# Patient Record
Sex: Female | Born: 1980 | Race: Black or African American | Hispanic: No | Marital: Single | Smoking: Never smoker
Health system: Southern US, Community
[De-identification: ages and names within clinical notes are randomized; demographics above are authoritative.]

## PROBLEM LIST (undated history)

## (undated) DIAGNOSIS — F419 Anxiety disorder, unspecified: Secondary | ICD-10-CM

## (undated) DIAGNOSIS — F32A Depression, unspecified: Secondary | ICD-10-CM

## (undated) DIAGNOSIS — I10 Essential (primary) hypertension: Secondary | ICD-10-CM

## (undated) DIAGNOSIS — G4700 Insomnia, unspecified: Secondary | ICD-10-CM

## (undated) DIAGNOSIS — T7840XA Allergy, unspecified, initial encounter: Secondary | ICD-10-CM

## (undated) DIAGNOSIS — J45909 Unspecified asthma, uncomplicated: Secondary | ICD-10-CM

## (undated) HISTORY — DX: Depression, unspecified: F32.A

## (undated) HISTORY — DX: Allergy, unspecified, initial encounter: T78.40XA

## (undated) HISTORY — PX: EYE SURGERY: SHX253

## (undated) HISTORY — DX: Unspecified asthma, uncomplicated: J45.909

## (undated) HISTORY — DX: Insomnia, unspecified: G47.00

## (undated) HISTORY — PX: FRACTURE SURGERY: SHX138

## (undated) HISTORY — DX: Anxiety disorder, unspecified: F41.9

## (undated) HISTORY — DX: Essential (primary) hypertension: I10

---
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
## 2015-12-31 HISTORY — PX: ANKLE FRACTURE SURGERY: SHX122

## 2019-05-25 ENCOUNTER — Ambulatory Visit: Payer: 59 | Attending: Internal Medicine

## 2019-05-25 DIAGNOSIS — Z23 Encounter for immunization: Secondary | ICD-10-CM

## 2019-05-25 NOTE — Progress Notes (Signed)
   Covid-19 Vaccination Clinic  Name:  Kaitlyn Choi    MRN: [id number] DOB: 08-07-1980  05/25/2019  [name] was observed post Covid-19 immunization for 15 minutes without incident. She was provided with Vaccine Information Sheet and instruction to access the V-Safe system.   [name] was instructed to call 911 with any severe reactions post vaccine: Marland Kitchen Difficulty breathing  . Swelling of face and throat  . A fast heartbeat  . A bad rash all over body  . Dizziness and weakness   Immunizations Administered    Name Date Dose VIS Date Route   Pfizer COVID-19 Vaccine 05/25/2019  8:24 AM 0.3 mL 02/09/2019 Intramuscular   Manufacturer: [employer], [employer]   Lot: [id number]   NDC: [address]-3

## 2019-06-20 ENCOUNTER — Ambulatory Visit: Payer: 59 | Attending: Internal Medicine

## 2019-06-20 DIAGNOSIS — Z23 Encounter for immunization: Secondary | ICD-10-CM

## 2019-06-20 NOTE — Progress Notes (Signed)
   Covid-19 Vaccination Clinic  Name:  Mckennah Kretchmer    MRN: [id number] DOB: 26-May-1980  06/20/2019  Ms. Fair was observed post Covid-19 immunization for 15 minutes without incident. She was provided with Vaccine Information Sheet and instruction to access the V-Safe system.   Ms. Eberlin was instructed to call 911 with any severe reactions post vaccine: Marland Kitchen Difficulty breathing  . Swelling of face and throat  . A fast heartbeat  . A bad rash all over body  . Dizziness and weakness   Immunizations Administered    Name Date Dose VIS Date Route   Pfizer COVID-19 Vaccine 06/20/2019 11:58 AM 0.3 mL 04/25/2018 Intramuscular   Manufacturer: [employer], [employer]   Lot: [id number]   NDC: [address]-5

## 2020-12-09 LAB — BASIC METABOLIC PANEL
CO2: 22 (ref 13–22)
Chloride: 104 (ref 99–108)
Creatinine: 0.9 (ref 0.5–1.1)
Glucose: 90
Potassium: 4.3 (ref 3.4–5.3)
Sodium: 140 (ref 137–147)

## 2020-12-09 LAB — LIPID PANEL
Cholesterol: 153 (ref 0–200)
HDL: 68 (ref 35–70)
LDL Cholesterol: 54
Triglycerides: 195 — AB (ref 40–160)

## 2020-12-09 LAB — HEPATIC FUNCTION PANEL
ALT: 14 (ref 7–35)
AST: 19 (ref 13–35)
Alkaline Phosphatase: 76 (ref 25–125)
Bilirubin, Total: 0.3

## 2020-12-09 LAB — COMPREHENSIVE METABOLIC PANEL
Albumin: 4 (ref 3.5–5.0)
Calcium: 9.3 (ref 8.7–10.7)
GFR calc Af Amer: 81
Globulin: 2.8

## 2020-12-09 LAB — HEMOGLOBIN A1C: Hemoglobin A1C: 5.6

## 2020-12-09 LAB — TSH: TSH: 1.81 (ref 0.41–5.90)

## 2020-12-17 ENCOUNTER — Encounter: Payer: Self-pay | Admitting: Family Medicine

## 2020-12-17 ENCOUNTER — Other Ambulatory Visit: Payer: Self-pay

## 2020-12-17 ENCOUNTER — Ambulatory Visit: Payer: 59 | Admitting: Family Medicine

## 2020-12-17 ENCOUNTER — Ambulatory Visit
Admission: RE | Admit: 2020-12-17 | Discharge: 2020-12-17 | Disposition: A | Discharge status: Home / Self Care | Payer: 59 | Attending: Family Medicine | Admitting: Family Medicine

## 2020-12-17 ENCOUNTER — Ambulatory Visit
Admission: RE | Admit: 2020-12-17 | Discharge: 2020-12-17 | Disposition: A | Discharge status: Home / Self Care | Payer: 59 | Source: Ambulatory Visit | Attending: Family Medicine | Admitting: Family Medicine

## 2020-12-17 VITALS — BP 124/86 | HR 86 | Temp 98.3°F | Ht 67.0 in | Wt 231.0 lb

## 2020-12-17 DIAGNOSIS — M545 Low back pain, unspecified: Secondary | ICD-10-CM | POA: Insufficient documentation

## 2020-12-17 DIAGNOSIS — R8290 Unspecified abnormal findings in urine: Secondary | ICD-10-CM

## 2020-12-17 DIAGNOSIS — F419 Anxiety disorder, unspecified: Secondary | ICD-10-CM

## 2020-12-17 DIAGNOSIS — R109 Unspecified abdominal pain: Secondary | ICD-10-CM | POA: Insufficient documentation

## 2020-12-17 DIAGNOSIS — G8929 Other chronic pain: Secondary | ICD-10-CM

## 2020-12-17 DIAGNOSIS — F32A Depression, unspecified: Secondary | ICD-10-CM | POA: Insufficient documentation

## 2020-12-17 HISTORY — DX: Low back pain, unspecified: M54.50

## 2020-12-17 HISTORY — DX: Unspecified abdominal pain: R10.9

## 2020-12-17 HISTORY — DX: Unspecified abnormal findings in urine: R82.90

## 2020-12-17 HISTORY — DX: Depression, unspecified: F32.A

## 2020-12-17 HISTORY — DX: Anxiety disorder, unspecified: F41.9

## 2020-12-17 LAB — POCT URINALYSIS DIPSTICK
Bilirubin, UA: NEGATIVE
Glucose, UA: NEGATIVE
Ketones, UA: NEGATIVE
Leukocytes, UA: NEGATIVE
Nitrite, UA: NEGATIVE
Protein, UA: NEGATIVE
Spec Grav, UA: 1.015 (ref 1.010–1.025)
Urobilinogen, UA: 0.2 E.U./dL
pH, UA: 6 (ref 5.0–8.0)

## 2020-12-17 MED ORDER — CITALOPRAM HYDROBROMIDE 10 MG PO TABS: 10.0000 mg | ORAL_TABLET | Freq: Every day | ORAL | 3 refills | Status: DC

## 2020-12-17 NOTE — Patient Instructions (Signed)
-   Obtain x-rays - Start citalopram 10 mg daily - We will contact you with xray and urine results - Contact for questions - Return in 2 weeks

## 2020-12-18 ENCOUNTER — Encounter: Payer: Self-pay | Admitting: Family Medicine

## 2020-12-18 NOTE — Assessment & Plan Note (Signed)
Depression screen PHQ 2/9 12/17/2020  Decreased Interest 2  Down, Depressed, Hopeless 2  PHQ - 2 Score 4  Altered sleeping 3  Tired, decreased energy 3  Change in appetite 1  Feeling bad or failure about yourself  0  Trouble concentrating 2  Moving slowly or fidgety/restless 0  Suicidal thoughts 0  PHQ-9 Score 13  Difficult doing work/chores Extremely dIfficult   GAD 7 : Generalized Anxiety Score 12/17/2020  Nervous, Anxious, on Edge 3  Control/stop worrying 2  Worry too much - different things 2  Trouble relaxing 2  Restless 0  Easily annoyed or irritable 3  Afraid - awful might happen 0  Total GAD 7 Score 12  Anxiety Difficulty Extremely difficult   Chronic issue with ongoing exacerbation, both pharmacologic and nonpharmacologic therapies outlined and patient is amenable to initiation of citalopram, this was prescribed will make a close follow-up in 2 weeks for this.

## 2020-12-18 NOTE — Assessment & Plan Note (Signed)
Chronic condition with continued exacerbation, examination findings do show focality to the paraspinal lumbar musculature and right hip intra-articularly, and dedicated x-rays of the lumbar spine and right hip ordered, we will follow-up on this issue at her return in 2 weeks.

## 2020-12-18 NOTE — Progress Notes (Signed)
Primary Care / Sports Medicine Office Visit  Patient Information:  Patient ID: Kaitlyn Choi, female DOB: May 06, 1980 Age: 40 y.o. MRN: [id number]   Kaitlyn Choi is a pleasant 40 y.o. female presenting with the following:  Chief Complaint  Patient presents with   New Patient (Initial Visit)   Establish Care    Following with weight loss program [facility], had labs drawn last week and concerned about high triglycerides and WBC in urine, did not fast for labs   Insomnia    X4 years; trouble going to sleep and staying asleep; takes Unisom, Benadryl, and Melatonin with some relief   Hip Pain    Right; x5 years; no known injury or trauma; intermittent; no pain in office today    Review of Systems pertinent details above   Patient Active Problem List   Diagnosis Date Noted   Anxiety and depression 12/17/2020   Flank pain 12/17/2020   Abnormal urinalysis 12/17/2020   Chronic right-sided low back pain without sciatica 12/17/2020   Asthma action plan declined 12/17/1983   Past Medical History:  Diagnosis Date   Asthma    Insomnia    Outpatient Encounter Medications as of 12/17/2020  Medication Sig   albuterol (VENTOLIN HFA) 108 (90 Base) MCG/ACT inhaler Inhale 1 puff into the lungs daily as needed.   citalopram (CELEXA) 10 MG tablet Take 1 tablet (10 mg total) by mouth daily.   clobetasol (TEMOVATE) 0.05 % external solution Apply 1 application topically 2 (two) times daily.   diphenhydrAMINE (BENADRYL) 25 MG tablet Take 50 mg by mouth at bedtime as needed.   doxylamine, Sleep, (UNISOM) 25 MG tablet Take 25 mg by mouth at bedtime as needed for sleep.   EPINEPHrine (PRIMATENE MIST IN) Inhale 2 puffs into the lungs as needed.   MELATONIN PO Take 12 mg by mouth at bedtime.   Naltrexone-buPROPion HCl ER (CONTRAVE) 8-90 MG TB12 Take 1 tablet by mouth 2 (two) times daily. (Patient not taking: Reported on 12/17/2020)   No facility-administered encounter medications on file as of  12/17/2020.   Past Surgical History:  Procedure Laterality Date   ANKLE FRACTURE SURGERY Right 12/2015    Vitals:   12/17/20 1006  BP: 124/86  Pulse: 86  Temp: 98.3 F (36.8 C)  SpO2: 98%   Vitals:   12/17/20 1006  Weight: 231 lb (104.8 kg)  Height: 5\' 7"  (1.702 m)   Body mass index is 36.18 kg/m.  DG Lumbar Spine Complete  Result Date: 12/17/2020 CLINICAL DATA:  Low back pain for years, no known injury, initial encounter EXAM: LUMBAR SPINE - COMPLETE 4+ VIEW COMPARISON:  None. FINDINGS: Five lumbar type vertebral bodies are well visualized. Vertebral body height is well maintained. No pars defects are noted. No anterolisthesis is seen. No soft tissue abnormality is noted. IMPRESSION: No acute abnormality seen. Electronically Signed   By: 12/19/2020 M.D.   On: 12/17/2020 23:57   DG Hip Unilat W OR W/O Pelvis 2-3 Views Right  Result Date: 12/17/2020 CLINICAL DATA:  Chronic right hip pain initial encounter EXAM: DG HIP (WITH OR WITHOUT PELVIS) 3V RIGHT COMPARISON:  None. FINDINGS: Pelvic ring is intact. Degenerative changes of the hip joints are noted bilaterally. No acute fracture or dislocation is seen. No soft tissue abnormality is noted. IMPRESSION: No acute abnormality noted. Electronically Signed   By: 12/19/2020 M.D.   On: 12/17/2020 23:58     Independent interpretation of notes and tests performed by another  provider:   None  Procedures performed:   None  Pertinent History, Exam, Impression, and Recommendations:   Anxiety and depression Depression screen PHQ 2/9 12/17/2020  Decreased Interest 2  Down, Depressed, Hopeless 2  PHQ - 2 Score 4  Altered sleeping 3  Tired, decreased energy 3  Change in appetite 1  Feeling bad or failure about yourself  0  Trouble concentrating 2  Moving slowly or fidgety/restless 0  Suicidal thoughts 0  PHQ-9 Score 13  Difficult doing work/chores Extremely dIfficult   GAD 7 : Generalized Anxiety Score 12/17/2020   Nervous, Anxious, on Edge 3  Control/stop worrying 2  Worry too much - different things 2  Trouble relaxing 2  Restless 0  Easily annoyed or irritable 3  Afraid - awful might happen 0  Total GAD 7 Score 12  Anxiety Difficulty Extremely difficult   Chronic issue with ongoing exacerbation, both pharmacologic and nonpharmacologic therapies outlined and patient is amenable to initiation of citalopram, this was prescribed will make a close follow-up in 2 weeks for this.  Abnormal urinalysis Patient has recently completed antibiotics for the same issue, still noting right-sided flank pain.  Physical examination reveals no CVA tenderness, abdominal examination benign, mild suprapubic tenderness. UA with blood, this will be sent for culture.  We will follow-up results once obtained.  Chronic right-sided low back pain without sciatica Chronic condition with continued exacerbation, examination findings do show focality to the paraspinal lumbar musculature and right hip intra-articularly, and dedicated x-rays of the lumbar spine and right hip ordered, we will follow-up on this issue at her return in 2 weeks.   Orders & Medications Meds ordered this encounter  Medications   citalopram (CELEXA) 10 MG tablet    Sig: Take 1 tablet (10 mg total) by mouth daily.    Dispense:  30 tablet    Refill:  3   Orders Placed This Encounter  Procedures   Urine Culture   DG Lumbar Spine Complete   DG Hip Unilat W OR W/O Pelvis 2-3 Views Right   POCT Urinalysis Dipstick     Return in about 2 weeks (around 12/31/2020) for annual physical.     Jerrol Banana, MD   Primary Care Sports Medicine [facility]

## 2020-12-18 NOTE — Assessment & Plan Note (Signed)
Patient has recently completed antibiotics for the same issue, still noting right-sided flank pain.  Physical examination reveals no CVA tenderness, abdominal examination benign, mild suprapubic tenderness. UA with blood, this will be sent for culture.  We will follow-up results once obtained.

## 2020-12-19 ENCOUNTER — Encounter: Payer: Self-pay | Admitting: Family Medicine

## 2020-12-20 LAB — URINE CULTURE: Organism ID, Bacteria: NO GROWTH

## 2020-12-20 LAB — SPECIMEN STATUS REPORT

## 2020-12-22 ENCOUNTER — Other Ambulatory Visit: Payer: Self-pay | Admitting: Family Medicine

## 2020-12-22 DIAGNOSIS — E785 Hyperlipidemia, unspecified: Secondary | ICD-10-CM

## 2020-12-22 DIAGNOSIS — Z7689 Persons encountering health services in other specified circumstances: Secondary | ICD-10-CM

## 2020-12-22 MED ORDER — OZEMPIC (0.25 OR 0.5 MG/DOSE) 2 MG/1.5ML ~~LOC~~ SOPN: PEN_INJECTOR | SUBCUTANEOUS | 0 refills | Status: DC

## 2020-12-23 NOTE — Telephone Encounter (Signed)
For your information  

## 2020-12-25 NOTE — Telephone Encounter (Signed)
Labs have been abstracted.  For your information.

## 2021-01-01 ENCOUNTER — Ambulatory Visit: Payer: 59 | Admitting: Family Medicine

## 2021-01-08 ENCOUNTER — Other Ambulatory Visit: Payer: Self-pay | Admitting: Family Medicine

## 2021-01-08 DIAGNOSIS — F419 Anxiety disorder, unspecified: Secondary | ICD-10-CM

## 2021-01-08 DIAGNOSIS — F32A Depression, unspecified: Secondary | ICD-10-CM

## 2021-01-08 NOTE — Telephone Encounter (Signed)
Requested medication (s) are due for refill today: No  Requested medication (s) are on the active medication list: Yes  Last refill:  12/17/20  Future visit scheduled: Yes  Notes to clinic:  Pharmacy requesting 90 day supply.    Requested Prescriptions  Pending Prescriptions Disp Refills   citalopram (CELEXA) 10 MG tablet [Pharmacy Med Name: CITALOPRAM HBR 10 MG TABLET] 90 tablet 2    Sig: TAKE 1 TABLET BY MOUTH EVERY DAY     Psychiatry:  Antidepressants - SSRI Passed - 01/08/2021  1:31 PM      Passed - Completed PHQ-2 or PHQ-9 in the last 360 days      Passed - Valid encounter within last 6 months    Recent Outpatient Visits           3 weeks ago Anxiety and depression   [facility] Jerrol Banana, MD       Future Appointments             In 6 days Ashley Royalty, Ocie Bob, MD [facility], [facility]

## 2021-01-13 ENCOUNTER — Telehealth: Payer: Self-pay

## 2021-01-13 NOTE — Telephone Encounter (Signed)
Semaglutide,0.25 or 0.5MG /DOS, (OZEMPIC, 0.25 OR 0.5 MG/DOSE,) 2 MG/1.5ML SOPN [[id number]]

## 2021-01-13 NOTE — Telephone Encounter (Signed)
Please reschedule patient's appointment tomorrow for the first week in December per patient's request.

## 2021-01-13 NOTE — Telephone Encounter (Signed)
Please advise if okay to refill Ozempic.  [place] was scheduled for tomorrow[place] and rescheduled to 02/06/21.

## 2021-01-14 ENCOUNTER — Ambulatory Visit: Payer: 59 | Admitting: Family Medicine

## 2021-01-14 NOTE — Telephone Encounter (Signed)
Spoke with Chana Bode at [facility] who states refill request was automated.  Nothing further to do.

## 2021-01-17 ENCOUNTER — Other Ambulatory Visit: Payer: Self-pay | Admitting: Family Medicine

## 2021-01-17 DIAGNOSIS — Z6836 Body mass index (BMI) 36.0-36.9, adult: Secondary | ICD-10-CM

## 2021-01-17 DIAGNOSIS — Z7689 Persons encountering health services in other specified circumstances: Secondary | ICD-10-CM

## 2021-01-17 DIAGNOSIS — E785 Hyperlipidemia, unspecified: Secondary | ICD-10-CM

## 2021-01-18 NOTE — Telephone Encounter (Signed)
Requested Prescriptions  Pending Prescriptions Disp Refills  . Semaglutide,0.25 or 0.5MG /DOS, (OZEMPIC, 0.25 OR 0.5 MG/DOSE,) 2 MG/1.5ML SOPN [Pharmacy Med Name: OZEMPIC 0.25-0.5 MG/DOSE PEN] 1.5 mL 0    Sig: INJECT 0.25 MG SUBQ EVERY WK FOR 4 WK THEN INJECT 0.5 MG SUBQ EVERY WEEK X4 WK, THEN GO TO FULL DOSE PEN EVERY WEEK     Endocrinology:  Diabetes - GLP-1 Receptor Agonists Passed - 01/17/2021  2:06 PM      Passed - HBA1C is between 0 and 7.9 and within 180 days    Hemoglobin A1C  Date Value Ref Range Status  12/09/2020 5.6  Final         Passed - Valid encounter within last 6 months    Recent Outpatient Visits          1 month ago Anxiety and depression   [facility] Jerrol Banana, MD      Future Appointments            In 4 weeks Ashley Royalty, Ocie Bob, MD [facility], [facility]

## 2021-01-28 ENCOUNTER — Other Ambulatory Visit: Payer: Self-pay

## 2021-01-28 DIAGNOSIS — E785 Hyperlipidemia, unspecified: Secondary | ICD-10-CM

## 2021-01-28 DIAGNOSIS — Z7689 Persons encountering health services in other specified circumstances: Secondary | ICD-10-CM

## 2021-01-28 DIAGNOSIS — Z6836 Body mass index (BMI) 36.0-36.9, adult: Secondary | ICD-10-CM

## 2021-01-28 MED ORDER — OZEMPIC (0.25 OR 0.5 MG/DOSE) 2 MG/1.5ML ~~LOC~~ SOPN: 0.5000 mg | PEN_INJECTOR | SUBCUTANEOUS | 0 refills | Status: DC

## 2021-01-29 ENCOUNTER — Encounter: Payer: 59 | Admitting: Obstetrics and Gynecology

## 2021-02-06 ENCOUNTER — Ambulatory Visit: Payer: 59 | Admitting: Family Medicine

## 2021-02-16 ENCOUNTER — Ambulatory Visit: Payer: 59 | Admitting: Family Medicine

## 2021-02-16 ENCOUNTER — Encounter: Payer: 59 | Admitting: Family Medicine

## 2021-03-12 ENCOUNTER — Encounter: Payer: Self-pay | Admitting: Family Medicine

## 2021-03-12 ENCOUNTER — Other Ambulatory Visit: Payer: Self-pay

## 2021-03-12 ENCOUNTER — Telehealth (INDEPENDENT_AMBULATORY_CARE_PROVIDER_SITE_OTHER): Payer: Self-pay | Admitting: Family Medicine

## 2021-03-12 VITALS — Temp 97.3°F | Ht 67.0 in | Wt 224.0 lb

## 2021-03-12 DIAGNOSIS — F32A Depression, unspecified: Secondary | ICD-10-CM

## 2021-03-12 DIAGNOSIS — U071 COVID-19: Secondary | ICD-10-CM | POA: Insufficient documentation

## 2021-03-12 DIAGNOSIS — Z6836 Body mass index (BMI) 36.0-36.9, adult: Secondary | ICD-10-CM

## 2021-03-12 DIAGNOSIS — F419 Anxiety disorder, unspecified: Secondary | ICD-10-CM

## 2021-03-12 DIAGNOSIS — E66812 Obesity, class 2: Secondary | ICD-10-CM | POA: Insufficient documentation

## 2021-03-12 HISTORY — DX: COVID-19: U07.1

## 2021-03-12 MED ORDER — PROMETHAZINE-DM 6.25-15 MG/5ML PO SYRP: 5.0000 mL | ORAL_SOLUTION | Freq: Four times a day (QID) | ORAL | 0 refills | Status: DC | PRN

## 2021-03-12 MED ORDER — WEGOVY 1 MG/0.5ML ~~LOC~~ SOAJ: 1.0000 mg | SUBCUTANEOUS | 0 refills | Status: DC

## 2021-03-12 NOTE — Assessment & Plan Note (Signed)
Chronic condition that is stable, she has endorsed weight loss since initiating semaglutide, tolerating current dose without adverse effects.  I have advised continued dosing after discussion of nonmedication management.  She has requested a transition to [facility] from [place], a new Rx was generated in that regard today.  We will recheck her weight in clinic in 1 month at her annual physical.

## 2021-03-12 NOTE — Patient Instructions (Addendum)
-   Start daily vitamin D and zinc supplement x10 days - Use Flonase, 2 sprays in each nostril daily x10 days - Can use cough syrup on as-needed basis - Ensure adequate hydration, get plenty of rest - Additionally can use ibuprofen and antihistamine on an as-needed basis - Contact our office for any persistent symptoms without improvement after 7-10 days - Continue semaglutide weekly injections [facility] Rx sent to pharmacy), contact us for any issues filling this - Return for follow-up in 1 month for annual physical

## 2021-03-12 NOTE — Assessment & Plan Note (Signed)
Patient with congestion, productive cough, afebrile, without shortness of air over the past few days, serial testing for COVID was performed with positive test this morning.  She does report positive COVID exposure as well.  She has held from OTC treatments until she has had a chance to speak with [place].  Given her COVID-19 risk of complication score being low, I have advised supportive care, appropriate isolation guidelines, and we will coordinate a follow-up in 1 month for her annual physical.  She was advised to contact us for any persistent symptomatology without lack of improvement over the next 7-10 days.

## 2021-03-12 NOTE — Progress Notes (Signed)
Primary Care / Sports Medicine Virtual Visit  Patient Information:  Patient ID: Kaitlyn Choi, female DOB: December 13, 1980 Age: 41 y.o. MRN: [id number]   Kaitlyn Choi is a pleasant 41 y.o. female presenting with the following:  Chief Complaint  Patient presents with   Covid Positive    Home test positive this morning, 03/12/21; positive Covid exposure; productive cough, congestion    Review of Systems: No fevers, chills, night sweats, weight loss, chest pain, or shortness of breath.   Patient Active Problem List   Diagnosis Date Noted   COVID 03/12/2021   Class 2 severe obesity due to excess calories with serious comorbidity and body mass index (BMI) of 36.0 to 36.9 in adult [facility] 03/12/2021   Anxiety and depression 12/17/2020   Flank pain 12/17/2020   Abnormal urinalysis 12/17/2020   Chronic right-sided low back pain without sciatica 12/17/2020   Asthma action plan declined 12/17/1983   Past Medical History:  Diagnosis Date   Anxiety    Asthma    Depression    Insomnia    Outpatient Encounter Medications as of 03/12/2021  Medication Sig   albuterol (VENTOLIN HFA) 108 (90 Base) MCG/ACT inhaler Inhale 1 puff into the lungs daily as needed.   citalopram (CELEXA) 10 MG tablet Take 1 tablet (10 mg total) by mouth daily.   clobetasol (TEMOVATE) 0.05 % external solution Apply 1 application topically 2 (two) times daily.   diphenhydrAMINE (BENADRYL) 25 MG tablet Take 50 mg by mouth at bedtime as needed.   doxylamine, Sleep, (UNISOM) 25 MG tablet Take 25 mg by mouth at bedtime as needed for sleep.   EPINEPHrine (PRIMATENE MIST IN) Inhale 2 puffs into the lungs as needed.   MELATONIN PO Take 12 mg by mouth at bedtime.   promethazine-dextromethorphan (PROMETHAZINE-DM) 6.25-15 MG/5ML syrup Take 5 mLs by mouth 4 (four) times daily as needed for cough.   WEGOVY 1 MG/0.5ML SOAJ Inject 1 mg into the skin once a week.   [DISCONTINUED] Semaglutide,0.25 or 0.5MG /DOS, (OZEMPIC, 0.25 OR 0.5  MG/DOSE,) 2 MG/1.5ML SOPN Inject 0.5 mg into the skin every 7 (seven) days.   [DISCONTINUED] Naltrexone-buPROPion HCl ER (CONTRAVE) 8-90 MG TB12 Take 1 tablet by mouth 2 (two) times daily. (Patient not taking: Reported on 12/17/2020)   No facility-administered encounter medications on file as of 03/12/2021.   Past Surgical History:  Procedure Laterality Date   ANKLE FRACTURE SURGERY Right 12/2015    Virtual Visit via MyChart Video:   I connected with Kaitlyn Choi on 03/12/21 via MyChart Video and verified that I am speaking with the correct person using appropriate identifiers.   The limitations, risks, security and privacy concerns of performing an evaluation and management service by MyChart Video, including the higher likelihood of inaccurate diagnoses and treatments, and the availability of in person appointments were reviewed. The possible need of an additional face-to-face encounter for complete and high quality delivery of care was discussed. The patient was also made aware that there may be a patient responsible charge related to this service. The patient expressed understanding and wishes to proceed.  Provider location is in medical facility. Patient location is at their home, different from provider location. People involved in care of the patient during this telehealth encounter were myself, my nurse/medical assistant, and my front office/scheduling team member.  Objective findings:   General: Speaking full sentences, no audible heavy breathing. Sounds alert and appropriately interactive. Well-appearing. Face symmetric. Extraocular movements intact. Pupils equal and round. No nasal  flaring or accessory muscle use visualized.  Independent interpretation of notes and tests performed by another provider:   None  Pertinent History, Exam, Impression, and Recommendations:   COVID Patient with congestion, productive cough, afebrile, without shortness of air over the past few days,  serial testing for COVID was performed with positive test this morning.  She does report positive COVID exposure as well.  She has held from OTC treatments until she has had a chance to speak with [place].  Given her COVID-19 risk of complication score being low, I have advised supportive care, appropriate isolation guidelines, and we will coordinate a follow-up in 1 month for her annual physical.  She was advised to contact us for any persistent symptomatology without lack of improvement over the next 7-10 days.  Class 2 severe obesity due to excess calories with serious comorbidity and body mass index (BMI) of 36.0 to 36.9 in adult [facility]) Chronic condition that is stable, she has endorsed weight loss since initiating semaglutide, tolerating current dose without adverse effects.  I have advised continued dosing after discussion of nonmedication management.  She has requested a transition to [facility] from [place], a new Rx was generated in that regard today.  We will recheck her weight in clinic in 1 month at her annual physical.  Anxiety and depression Chronic condition that is stable, has been tolerating citalopram 10 mg well without issues reported.  Has noted improvement.  At this stage plan to continue current regimen, need for further titration can be reviewed at her return for annual physical.  Orders & Medications Meds ordered this encounter  Medications   WEGOVY 1 MG/0.5ML SOAJ    Sig: Inject 1 mg into the skin once a week.    Dispense:  4 mL    Refill:  0   promethazine-dextromethorphan (PROMETHAZINE-DM) 6.25-15 MG/5ML syrup    Sig: Take 5 mLs by mouth 4 (four) times daily as needed for cough.    Dispense:  118 mL    Refill:  0   No orders of the defined types were placed in this encounter.    I discussed the above assessment and treatment plan with the patient. The patient was provided an opportunity to ask questions and all were answered. The patient agreed with the plan and demonstrated  an understanding of the instructions.   The patient was advised to call back or seek an in-person evaluation if the symptoms worsen or if the condition fails to improve as anticipated.   Jerrol Banana, MD   Primary Care Sports Medicine [facility]

## 2021-03-12 NOTE — Assessment & Plan Note (Signed)
Chronic condition that is stable, has been tolerating citalopram 10 mg well without issues reported.  Has noted improvement.  At this stage plan to continue current regimen, need for further titration can be reviewed at her return for annual physical.

## 2021-03-13 ENCOUNTER — Telehealth: Payer: Self-pay

## 2021-03-13 NOTE — Telephone Encounter (Signed)
PA submitted yesterday for [facility] through CoverMyMeds.  Received confirmation of approved coverage for [facility] via fax from 03/12/2021-09/10/2021 through [facility].  For your information.

## 2021-03-20 ENCOUNTER — Encounter: Payer: Self-pay | Admitting: Family Medicine

## 2021-03-23 ENCOUNTER — Encounter: Payer: 59 | Admitting: Obstetrics and Gynecology

## 2021-04-13 ENCOUNTER — Encounter: Payer: Self-pay | Admitting: Family Medicine

## 2021-04-14 ENCOUNTER — Other Ambulatory Visit: Payer: Self-pay | Admitting: Family Medicine

## 2021-04-14 DIAGNOSIS — Z6836 Body mass index (BMI) 36.0-36.9, adult: Secondary | ICD-10-CM

## 2021-04-15 NOTE — Telephone Encounter (Signed)
Requested medications are due for refill today.  yes  Requested medications are on the active medications list.  yes  Last refill. 03/12/2021 78mL 0 refills  Future visit scheduled.   yes  Notes to clinic.  Trial with this medication. Appointment with Dr. Ashley Royalty tomorrow. Please advise.    Requested Prescriptions  Pending Prescriptions Disp Refills   WEGOVY 1 MG/0.5ML SOAJ [Pharmacy Med Name: WEGOVY 1 MG/0.5 ML PEN]      Sig: INJECT 1MG  INTO THE SKIN ONCE A WEEK     Endocrinology:  Diabetes - GLP-1 Receptor Agonists - semaglutide Passed - 04/14/2021 12:39 PM      Passed - HBA1C in normal range and within 180 days    Hemoglobin A1C  Date Value Ref Range Status  12/09/2020 5.6  Final          Passed - Cr in normal range and within 360 days    Creatinine  Date Value Ref Range Status  12/09/2020 0.9 0.5 - 1.1 Final          Passed - Valid encounter within last 6 months    Recent Outpatient Visits           1 month ago COVID   [facility], MD   3 months ago Anxiety and depression   [facility] Jerrol Banana, MD       Future Appointments             Tomorrow Jerrol Banana, MD [facility], [facility]

## 2021-04-16 ENCOUNTER — Ambulatory Visit (INDEPENDENT_AMBULATORY_CARE_PROVIDER_SITE_OTHER): Payer: BC Managed Care – PPO | Admitting: Family Medicine

## 2021-04-16 ENCOUNTER — Encounter: Payer: Self-pay | Admitting: Family Medicine

## 2021-04-16 ENCOUNTER — Other Ambulatory Visit: Payer: Self-pay

## 2021-04-16 VITALS — BP 142/94 | HR 86 | Ht 67.0 in | Wt 225.0 lb

## 2021-04-16 DIAGNOSIS — Z0000 Encounter for general adult medical examination without abnormal findings: Secondary | ICD-10-CM | POA: Diagnosis not present

## 2021-04-16 DIAGNOSIS — R7989 Other specified abnormal findings of blood chemistry: Secondary | ICD-10-CM | POA: Diagnosis not present

## 2021-04-16 DIAGNOSIS — Z6836 Body mass index (BMI) 36.0-36.9, adult: Secondary | ICD-10-CM

## 2021-04-16 DIAGNOSIS — Z23 Encounter for immunization: Secondary | ICD-10-CM

## 2021-04-16 DIAGNOSIS — F419 Anxiety disorder, unspecified: Secondary | ICD-10-CM

## 2021-04-16 DIAGNOSIS — Z13220 Encounter for screening for lipoid disorders: Secondary | ICD-10-CM

## 2021-04-16 DIAGNOSIS — R1011 Right upper quadrant pain: Secondary | ICD-10-CM

## 2021-04-16 DIAGNOSIS — F32A Depression, unspecified: Secondary | ICD-10-CM

## 2021-04-16 DIAGNOSIS — G4700 Insomnia, unspecified: Secondary | ICD-10-CM

## 2021-04-16 DIAGNOSIS — R739 Hyperglycemia, unspecified: Secondary | ICD-10-CM | POA: Diagnosis not present

## 2021-04-16 DIAGNOSIS — R7301 Impaired fasting glucose: Secondary | ICD-10-CM | POA: Diagnosis not present

## 2021-04-16 DIAGNOSIS — E66812 Obesity, class 2: Secondary | ICD-10-CM

## 2021-04-16 HISTORY — DX: Right upper quadrant pain: R10.11

## 2021-04-16 HISTORY — DX: Encounter for general adult medical examination without abnormal findings: Z00.00

## 2021-04-16 MED ORDER — WEGOVY 1.7 MG/0.75ML ~~LOC~~ SOAJ: 1.7000 mg | SUBCUTANEOUS | 0 refills | Status: DC

## 2021-04-16 MED ORDER — CITALOPRAM HYDROBROMIDE 10 MG PO TABS: 10.0000 mg | ORAL_TABLET | Freq: Every day | ORAL | 0 refills | Status: DC

## 2021-04-16 NOTE — Patient Instructions (Addendum)
-   Obtain fasting labs with orders provided (can have water or black coffee but otherwise no food or drink x 8 hours before labs) - Review information provided - Increase physical activity, free water and fiber intake - Restart Celexa 10 mg daily - Start new dose of Wegovy weekly - Attend eye doctor annually, dentist every 6 months, work towards or maintain 30 minutes of moderate intensity physical activity at least 5 days per week, and consume a balanced diet - Return in 1 year for physical - Contact us for any questions between now and then   [facility]: [phone]

## 2021-04-16 NOTE — Assessment & Plan Note (Signed)
Annual examination completed, risk stratification labs ordered, anticipatory guidance provided.  We will follow labs once resulted.  Tdap administered today. 

## 2021-04-16 NOTE — Assessment & Plan Note (Signed)
Patient has been tolerating gradually titrating doses of Wegovy, has noted mild GI disturbance on recent 1 mg q. weekly dose but not at an intolerable level.  Her BMI has decreased from initial visit at 36.18 to current 35.24.  We discussed the nature of Wegovy, overarching goals, and discussed further titration which she is amenable to.  I have written for new Wegovy 1.7 mg weekly, Rx sent accordingly.  She will return in 4 weeks for reevaluation and possible further titration.  Chronic condition, active/uncontrolled, Rx management

## 2021-04-16 NOTE — Progress Notes (Signed)
Annual Physical Exam Visit  Patient Information:  Patient ID: Kaitlyn Choi, female DOB: 11-Jan-1981 Age: 41 y.o. MRN: [id number]   Subjective:   CC: Annual Physical Exam  HPI:  Kaitlyn Choi is here for their annual physical.  I reviewed the past medical history, family history, social history, surgical history, and allergies today and changes were made as necessary.  Please see the problem list section below for additional details.  Past Medical History: Past Medical History:  Diagnosis Date   Anxiety    Asthma    Depression    Insomnia    Past Surgical History: Past Surgical History:  Procedure Laterality Date   ANKLE FRACTURE SURGERY Right 12/2015   Family History: Family History  Problem Relation Age of Onset   Stroke Father    Lung cancer Maternal Grandmother    Sudden Cardiac Death Maternal Grandfather    Hypertension Paternal Grandmother    Kidney failure Paternal Grandmother    Breast cancer Paternal Grandmother    Stroke Paternal Grandfather    Allergies: Allergies  Allergen Reactions   Latex Rash   Health Maintenance: Health Maintenance  Topic Date Due   PAP SMEAR-Modifier  12/04/2021   TETANUS/TDAP  04/17/2031   INFLUENZA VACCINE  Completed   COVID-19 Vaccine  Completed   Hepatitis C Screening  Completed   HIV Screening  Completed   HPV VACCINES  Aged Out    HM Colonoscopy     This patient has no relevant Health Maintenance data.      Medications: Current Outpatient Medications on File Prior to Visit  Medication Sig Dispense Refill   albuterol (VENTOLIN HFA) 108 (90 Base) MCG/ACT inhaler Inhale 1 puff into the lungs daily as needed.     clobetasol (TEMOVATE) 0.05 % external solution Apply 1 application topically 2 (two) times daily.     diphenhydrAMINE (BENADRYL) 25 MG tablet Take 50 mg by mouth at bedtime as needed.     doxylamine, Sleep, (UNISOM) 25 MG tablet Take 25 mg by mouth at bedtime as needed for sleep.     EPINEPHrine  (PRIMATENE MIST IN) Inhale 2 puffs into the lungs as needed.     MELATONIN PO Take 12 mg by mouth at bedtime.     No current facility-administered medications on file prior to visit.    Review of Systems: No headache, visual changes, nausea, vomiting, diarrhea, constipation, dizziness, abdominal pain, skin rash, fevers, chills, night sweats, swollen lymph nodes, weight loss, chest pain, body aches, joint swelling, muscle aches, shortness of breath, mood changes, visual or auditory hallucinations reported.  Objective:   Vitals:   04/16/21 0947  BP: (!) 142/94  Pulse: 86   Vitals:   04/16/21 0947  Weight: 225 lb (102.1 kg)  Height: 5\' 7"  (1.702 m)   Body mass index is 35.24 kg/m.  General: Well Developed, well nourished, and in no acute distress.  Neuro: Alert and oriented x3, extra-ocular muscles intact, sensation grossly intact. Cranial nerves II through XII are grossly intact, motor, sensory, and coordinative functions are intact. HEENT: Normocephalic, atraumatic, pupils equal round reactive to light, neck supple, no masses, no lymphadenopathy, thyroid nonpalpable. Oropharynx, nasopharynx, external ear canals are unremarkable. Skin: Warm and dry, no rashes noted.  Cardiac: Regular rate and rhythm, no murmurs rubs or gallops. No peripheral edema. Pulses symmetric. Respiratory: Clear to auscultation bilaterally. Not using accessory muscles, speaking in full sentences.  Abdominal: Soft, nondistended, hypoactive bowel sounds, no masses, no organomegaly.  Right upper quadrant  tenderness, negative Murphy sign, no rebound Musculoskeletal: Shoulder, elbow, wrist, hip, knee, ankle stable, and with full range of motion.  Female chaperone initials: BN present throughout the physical examination.  Impression and Recommendations:   The patient was counselled, risk factors were discussed, and anticipatory guidance given.  Anxiety and depression Patient has been dosing citalopram 10 mg  sporadically, has noted interim palpitations and sleep disturbance over this interval.  We discussed the same and she is amenable to a restart of scheduled citalopram 10 mg.  She has reported that she has been on higher doses in the past through psychiatry.  We will have the patient return for follow-up in 4 weeks for reassessment, monitoring of any possible symptom change/improvement.  At her return we can determine the need for further titration, additionally we can consider a referral to cardiology if palpitations persist without any noticeable improvement.  Chronic condition, symptomatic, Rx  Class 2 severe obesity due to excess calories with serious comorbidity and body mass index (BMI) of 36.0 to 36.9 in adult [facility]) Patient has been tolerating gradually titrating doses of Wegovy, has noted mild GI disturbance on recent 1 mg q. weekly dose but not at an intolerable level.  Her BMI has decreased from initial visit at 36.18 to current 35.24.  We discussed the nature of Wegovy, overarching goals, and discussed further titration which she is amenable to.  I have written for new Wegovy 1.7 mg weekly, Rx sent accordingly.  She will return in 4 weeks for reevaluation and possible further titration.  Chronic condition, active/uncontrolled, Rx management  Right upper quadrant pain Incidentally noted on physical examination today in the setting of mild constipation and reported hemorrhoids.  Most consistent with symptomatic constipation, we reviewed lifestyle changes from both a dietary and activity standpoint, materials provided.  We will follow-up on this issue at her return in 4 weeks while she makes these interventions and while we obtain routine labs.  For any residual symptoms, KUB x-ray, pharmacotherapy options to be considered.  Annual physical exam Annual examination completed, risk stratification labs ordered, anticipatory guidance provided.  We will follow labs once resulted.  Tdap administered  today.  Orders & Medications Medications:  Meds ordered this encounter  Medications   WEGOVY 1.7 MG/0.75ML SOAJ    Sig: Inject 1.7 mg into the skin once a week. Use this dose for 1 month (4 shots) and then increase to next higher dose.    Dispense:  3 mL    Refill:  0   citalopram (CELEXA) 10 MG tablet    Sig: Take 1 tablet (10 mg total) by mouth daily.    Dispense:  30 tablet    Refill:  0   Orders Placed This Encounter  Procedures   Tdap vaccine greater than or equal to 7yo IM   Apo A1 + B + Ratio   CBC   Comprehensive metabolic panel   Lipid panel   TSH   VITAMIN D 25 Hydroxy (Vit-D Deficiency, Fractures)   Ambulatory referral to ENT     Return in about 4 weeks (around 05/14/2021).    Montel Culver, MD   Primary Care Sports [facility] [place]

## 2021-04-16 NOTE — Assessment & Plan Note (Signed)
Patient has been dosing citalopram 10 mg sporadically, has noted interim palpitations and sleep disturbance over this interval.  We discussed the same and she is amenable to a restart of scheduled citalopram 10 mg.  She has reported that she has been on higher doses in the past through psychiatry.  We will have the patient return for follow-up in 4 weeks for reassessment, monitoring of any possible symptom change/improvement.  At her return we can determine the need for further titration, additionally we can consider a referral to cardiology if palpitations persist without any noticeable improvement.  Chronic condition, symptomatic, Rx

## 2021-04-16 NOTE — Assessment & Plan Note (Signed)
Incidentally noted on physical examination today in the setting of mild constipation and reported hemorrhoids.  Most consistent with symptomatic constipation, we reviewed lifestyle changes from both a dietary and activity standpoint, materials provided.  We will follow-up on this issue at her return in 4 weeks while she makes these interventions and while we obtain routine labs.  For any residual symptoms, KUB x-ray, pharmacotherapy options to be considered.

## 2021-04-17 ENCOUNTER — Telehealth: Payer: Self-pay

## 2021-04-17 ENCOUNTER — Other Ambulatory Visit: Payer: Self-pay | Admitting: Family Medicine

## 2021-04-17 DIAGNOSIS — R7989 Other specified abnormal findings of blood chemistry: Secondary | ICD-10-CM

## 2021-04-17 LAB — CBC
Hematocrit: 41.2 % (ref 34.0–46.6)
Hemoglobin: 13.3 g/dL (ref 11.1–15.9)
MCH: 28.3 pg (ref 26.6–33.0)
MCHC: 32.3 g/dL (ref 31.5–35.7)
MCV: 88 fL (ref 79–97)
Platelets: 346 10*3/uL (ref 150–450)
RBC: 4.7 x10E6/uL (ref 3.77–5.28)
RDW: 12.4 % (ref 11.7–15.4)
WBC: 5.7 10*3/uL (ref 3.4–10.8)

## 2021-04-17 LAB — COMPREHENSIVE METABOLIC PANEL WITH GFR
ALT: 27 [IU]/L (ref 0–32)
AST: 37 [IU]/L (ref 0–40)
Albumin/Globulin Ratio: 1.6 (ref 1.2–2.2)
Albumin: 4.4 g/dL (ref 3.8–4.8)
Alkaline Phosphatase: 61 [IU]/L (ref 44–121)
BUN/Creatinine Ratio: 9 (ref 9–23)
BUN: 9 mg/dL (ref 6–24)
Bilirubin Total: 1 mg/dL (ref 0.0–1.2)
CO2: 21 mmol/L (ref 20–29)
Calcium: 9 mg/dL (ref 8.7–10.2)
Chloride: 99 mmol/L (ref 96–106)
Creatinine, Ser: 0.97 mg/dL (ref 0.57–1.00)
Globulin, Total: 2.8 g/dL (ref 1.5–4.5)
Glucose: 101 mg/dL — ABNORMAL HIGH (ref 70–99)
Potassium: 4.2 mmol/L (ref 3.5–5.2)
Sodium: 137 mmol/L (ref 134–144)
Total Protein: 7.2 g/dL (ref 6.0–8.5)
eGFR: 75 mL/min/{1.73_m2}

## 2021-04-17 LAB — VITAMIN D 25 HYDROXY (VIT D DEFICIENCY, FRACTURES): Vit D, 25-Hydroxy: 13.5 ng/mL — ABNORMAL LOW (ref 30.0–100.0)

## 2021-04-17 LAB — LIPID PANEL
Chol/HDL Ratio: 1.8 ratio (ref 0.0–4.4)
Cholesterol, Total: 144 mg/dL (ref 100–199)
HDL: 81 mg/dL
LDL Chol Calc (NIH): 43 mg/dL (ref 0–99)
Triglycerides: 118 mg/dL (ref 0–149)
VLDL Cholesterol Cal: 20 mg/dL (ref 5–40)

## 2021-04-17 LAB — TSH: TSH: 1.6 u[IU]/mL (ref 0.450–4.500)

## 2021-04-17 LAB — APO A1 + B + RATIO
Apolipo. B/A-1 Ratio: 0.2 ratio (ref 0.0–0.6)
Apolipoprotein A-1: 213 mg/dL — ABNORMAL HIGH (ref 116–209)
Apolipoprotein B: 44 mg/dL

## 2021-04-17 MED ORDER — VITAMIN D (ERGOCALCIFEROL) 1.25 MG (50000 UNIT) PO CAPS: 50000.0000 [IU] | ORAL_CAPSULE | ORAL | 0 refills | Status: DC

## 2021-04-17 NOTE — Telephone Encounter (Signed)
PA submitted for Wegovy 1.7 mg weekly (increased dose) though CoverMyMeds.  Pending response.  For your information.

## 2021-04-20 ENCOUNTER — Encounter: Payer: Self-pay | Admitting: Family Medicine

## 2021-04-20 LAB — SPECIMEN STATUS REPORT

## 2021-04-20 LAB — HGB A1C W/O EAG: Hgb A1c MFr Bld: 5.6 % (ref 4.8–5.6)

## 2021-04-20 NOTE — Telephone Encounter (Signed)
PA approved through CoverMyMeds for increased dose.

## 2021-04-30 ENCOUNTER — Ambulatory Visit (INDEPENDENT_AMBULATORY_CARE_PROVIDER_SITE_OTHER): Payer: BC Managed Care – PPO | Admitting: Women's Health

## 2021-04-30 ENCOUNTER — Other Ambulatory Visit: Payer: Self-pay

## 2021-04-30 ENCOUNTER — Encounter: Payer: Self-pay | Admitting: Women's Health

## 2021-04-30 ENCOUNTER — Other Ambulatory Visit (HOSPITAL_COMMUNITY)
Admission: RE | Admit: 2021-04-30 | Discharge: 2021-04-30 | Disposition: A | Discharge status: Home / Self Care | Payer: BC Managed Care – PPO | Source: Ambulatory Visit | Attending: Obstetrics and Gynecology | Admitting: Obstetrics and Gynecology

## 2021-04-30 VITALS — BP 141/95 | HR 92 | Ht 67.0 in | Wt 225.8 lb

## 2021-04-30 DIAGNOSIS — Z202 Contact with and (suspected) exposure to infections with a predominantly sexual mode of transmission: Secondary | ICD-10-CM

## 2021-04-30 DIAGNOSIS — Z01419 Encounter for gynecological examination (general) (routine) without abnormal findings: Secondary | ICD-10-CM | POA: Diagnosis not present

## 2021-04-30 DIAGNOSIS — Z1231 Encounter for screening mammogram for malignant neoplasm of breast: Secondary | ICD-10-CM | POA: Diagnosis not present

## 2021-04-30 NOTE — Progress Notes (Signed)
Pt presents for annual exam and STD testing. Last PAP 11/2018. She is requesting referral for a mammogram. Pt has concerns about her having a child at her advanced maternal age. No other concerns at this time.  ?

## 2021-04-30 NOTE — Progress Notes (Signed)
? ? ?GYNECOLOGY ANNUAL PREVENTATIVE CARE ENCOUNTER NOTE ? ?History:    ? Kaitlyn Choi is a 41 y.o. No obstetric history on file. female here for a routine annual gynecologic exam.  Current complaints: none.   Denies abnormal vaginal bleeding, discharge, pelvic pain, problems with intercourse or other gynecologic concerns. ? ?Pt requests STD testing today. ?Pt reports she does not perform SBE. ?Pt reports bowel problems (constipation), but denies bladder concerns. Pt reports she discussed with PCP and is trying to increase fiber in diet. ?No family hx of colon, endometrial or ovarian cancer. Paternal GMA - breast cancer, older age.  ?Pt does not smoke, drink or use drugs. Pt reports social drinking.  ? ?  ?Gynecologic History ?Patient's last menstrual period was 04/16/2021 (exact date). ?Menstruation: once per month, 5 days, moderate bleeding, mild dysmenorrhea. ?Contraception: none. Pt reports she does desire pregnancy in the next year. ?Last Pap: 11/2018. Results were: normal per [facility]. Pt denies history of abnormal Pap. ? ?Obstetric History ?OB History  ?Gravida Para Term Preterm AB Living  ?0 0 0 0 0 0  ?SAB IAB Ectopic Multiple Live Births  ?0 0 0 0 0  ? ? ?Past Medical History:  ?Diagnosis Date  ? Anxiety   ? Asthma   ? Depression   ? Insomnia   ? ? ?Past Surgical History:  ?Procedure Laterality Date  ? ANKLE FRACTURE SURGERY Right 12/2015  ? ? ?Current Outpatient Medications on File Prior to Visit  ?Medication Sig Dispense Refill  ? albuterol (VENTOLIN HFA) 108 (90 Base) MCG/ACT inhaler Inhale 1 puff into the lungs daily as needed.    ? citalopram (CELEXA) 10 MG tablet Take 1 tablet (10 mg total) by mouth daily. 30 tablet 0  ? clobetasol (TEMOVATE) 0.05 % external solution Apply 1 application topically 2 (two) times daily.    ? diphenhydrAMINE (BENADRYL) 25 MG tablet Take 50 mg by mouth at bedtime as needed.    ? doxylamine, Sleep, (UNISOM) 25 MG tablet Take 25 mg by mouth at bedtime as needed for  sleep.    ? EPINEPHrine (PRIMATENE MIST IN) Inhale 2 puffs into the lungs as needed.    ? MELATONIN PO Take 12 mg by mouth at bedtime.    ? Vitamin D, Ergocalciferol, (DRISDOL) 1.25 MG (50000 UNIT) CAPS capsule Take 1 capsule (50,000 Units total) by mouth every 7 (seven) days. Take for 8 total doses(weeks) 8 capsule 0  ? WEGOVY 1.7 MG/0.75ML SOAJ Inject 1.7 mg into the skin once a week. Use this dose for 1 month (4 shots) and then increase to next higher dose. 3 mL 0  ? ?No current facility-administered medications on file prior to visit.  ? ? ?Allergies  ?Allergen Reactions  ? Latex Rash  ? ? ?Social History:  reports that she has never smoked. She has never used smokeless tobacco. She reports current alcohol use of about 5.0 standard drinks per week. She reports that she does not use drugs. ? ?Family History  ?Problem Relation Age of Onset  ? Stroke Father   ? Lung cancer Maternal Grandmother   ? Sudden Cardiac Death Maternal Grandfather   ? Hypertension Paternal Grandmother   ? Kidney failure Paternal Grandmother   ? Breast cancer Paternal Grandmother   ? Stroke Paternal Grandfather   ? ? ?The following portions of the patient's history were reviewed and updated as appropriate: allergies, current medications, past family history, past medical history, past social history, past surgical history and problem list. ? ?  Review of Systems ?Pertinent items noted in HPI and remainder of comprehensive ROS otherwise negative. ? ?Physical Exam:  ?BP (!) 141/95   Pulse 92   Ht 5\' 7"  (1.702 m)   Wt 225 lb 12.8 oz (102.4 kg)   LMP 04/16/2021 (Exact Date)   BMI 35.37 kg/m?  ?Pt reports BP was elevated recently at PCP, elevated here today, will recheck again with PCP this month. Advised of safe BP medications to take in pregnancy. ? ?CONSTITUTIONAL: Well-developed, well-nourished female in no acute distress.  ?HENT:  Normocephalic, atraumatic, External right and left ear normal. ?EYES: Conjunctivae and EOM are normal. Pupils  are equal, round, and reactive to light. No scleral icterus.  ?NECK: Normal range of motion, supple, no masses.  Normal thyroid.  ?SKIN: Skin is warm and dry. No rash noted. Not diaphoretic. No erythema. No pallor. ?MUSCULOSKELETAL: Normal range of motion. No tenderness.  No cyanosis, clubbing, or edema. ?NEUROLOGIC: Alert and oriented to person, place, and time. Normal reflexes, muscle tone coordination. ?PSYCHIATRIC: Normal mood and affect. Normal behavior. Normal judgment and thought content. ?CARDIOVASCULAR: Normal heart rate noted, regular rhythm. ?RESPIRATORY: Clear to auscultation bilaterally. Effort and breath sounds normal, no problems with respiration noted. ?BREASTS: Symmetric in size. No masses, skin changes, nipple drainage, or lymphadenopathy. Nipple piercing in place bilaterally. ?ABDOMEN: Soft, normal bowel sounds, no distention noted.  No tenderness, rebound or guarding.  ?PELVIC: Normal appearing external genitalia, no further exam performed as Pap not indicated, pt aasymptomatic. ?  ?Assessment and Plan:  ? ?1. Well woman exam ?-recommended starting PNVs now ? ?2. Encounter for screening mammogram for malignant neoplasm of breast ?- MM Digital Screening; Future ? ?3. Exposure to sexually transmitted disease (STD) ?- Cervicovaginal ancillary only( [place]) ?- HSV 1 antibody, IgG ?- HSV 2 antibody, IgG ?- HIV antibody (with reflex) ?- RPR ?- Hepatitis B Surface AntiGEN ?- Hepatitis C Antibody ?- pt counseled that herpes blood testing not standard of care, high likelihood of false positives as patient reports no outbreaks, prepared patient for positive result and advised that will not know how long patient has had infection or when she was infected if positive based on blood test results ? ?Will follow up results of pap smear and other testing, if performed, and manage accordingly. ?Mammogram ordered ?Routine preventative health maintenance measures emphasized. ?Self-breast awareness taught,  importance discussed, advised when to RTC, SBA literature given. ?Please refer to After Visit Summary for other counseling recommendations.  ?   ? ?04/18/2021 Coralie Stanke, WHNP-BC ?Women's Health Nurse Practitioner, Faculty Practice ?[facility] [place], [facility] ?

## 2021-04-30 NOTE — Patient Instructions (Signed)
Labetalol ?Procardia ?

## 2021-05-01 LAB — CERVICOVAGINAL ANCILLARY ONLY
Chlamydia: NEGATIVE
Comment: NEGATIVE
Comment: NEGATIVE
Comment: NORMAL
Neisseria Gonorrhea: NEGATIVE
Trichomonas: NEGATIVE

## 2021-05-01 LAB — HEPATITIS C ANTIBODY: Hep C Virus Ab: NONREACTIVE

## 2021-05-01 LAB — HEPATITIS B SURFACE ANTIGEN: Hepatitis B Surface Ag: NEGATIVE

## 2021-05-01 LAB — HSV 1 ANTIBODY, IGG: HSV 1 Glycoprotein G Ab, IgG: 38.8 index — ABNORMAL HIGH (ref 0.00–0.90)

## 2021-05-01 LAB — RPR: RPR Ser Ql: NONREACTIVE

## 2021-05-01 LAB — HSV 2 ANTIBODY, IGG: HSV 2 IgG, Type Spec: <0.91 index (ref 0.00–0.90)

## 2021-05-01 LAB — HIV ANTIBODY (ROUTINE TESTING W REFLEX): HIV Screen 4th Generation wRfx: NONREACTIVE

## 2021-05-11 ENCOUNTER — Other Ambulatory Visit: Payer: Self-pay | Admitting: Family Medicine

## 2021-05-11 DIAGNOSIS — F419 Anxiety disorder, unspecified: Secondary | ICD-10-CM

## 2021-05-12 NOTE — Telephone Encounter (Signed)
Requested medications are due for refill today.  yes ? ?Requested medications are on the active medications list.  yes ? ?Last refill. 04/16/2021 #30 0 refills ? ?Future visit scheduled.   yes ? ?Notes to clinic.  Pharmacy needs Dx code. ? ? ? ?Requested Prescriptions  ?Pending Prescriptions Disp Refills  ? citalopram (CELEXA) 10 MG tablet [Pharmacy Med Name: CITALOPRAM HBR 10 MG TABLET] 90 tablet 1  ?  Sig: TAKE 1 TABLET BY MOUTH EVERY DAY  ?  ? Psychiatry:  Antidepressants - SSRI Passed - 05/11/2021  1:32 PM  ?  ?  Passed - Completed PHQ-2 or PHQ-9 in the last 360 days  ?  ?  Passed - Valid encounter within last 6 months  ?  Recent Outpatient Visits   ? ?      ? 3 weeks ago Annual physical exam  ? [facility] Jerrol Banana, MD  ? 2 months ago COVID  ? [facility] Jerrol Banana, MD  ? 4 months ago Anxiety and depression  ? [facility] Jerrol Banana, MD  ? ?  ?  ?Future Appointments   ? ?        ? In 1 week Ashley Royalty Ocie Bob, MD [facility]  ? ?  ? ?  ?  ?  ?  ?

## 2021-05-14 ENCOUNTER — Ambulatory Visit: Payer: BC Managed Care – PPO | Admitting: Family Medicine

## 2021-05-14 ENCOUNTER — Ambulatory Visit: Payer: 59

## 2021-05-14 ENCOUNTER — Other Ambulatory Visit: Payer: Self-pay | Admitting: Family Medicine

## 2021-05-14 NOTE — Telephone Encounter (Signed)
Refused this refill request for Celexa 10 mg because it was [id number]90, 1 refill.  This [facility] is from [id number] in [place].   It was refused by [facility] staff on 05/12/2021 as not appropriate. ? ?

## 2021-05-15 ENCOUNTER — Other Ambulatory Visit: Payer: Self-pay | Admitting: Family Medicine

## 2021-05-15 DIAGNOSIS — F32A Depression, unspecified: Secondary | ICD-10-CM

## 2021-05-15 NOTE — Telephone Encounter (Signed)
[facility] called. Spoke to Pablo Pena, [place]. I asked the only rx I see that was sent for Celexa 10mg  was from 04/16/21 #30/0. She states that is correct and pt is due for refill. Advised I will send it over.  ?

## 2021-05-15 NOTE — Telephone Encounter (Signed)
Requested Prescriptions  ?Pending Prescriptions Disp Refills  ?? citalopram (CELEXA) 10 MG tablet [Pharmacy Med Name: CITALOPRAM HBR 10 MG TABLET] 30 tablet 0  ?  Sig: TAKE 1 TABLET BY MOUTH EVERY DAY  ?  ? Psychiatry:  Antidepressants - SSRI Passed - 05/15/2021  2:10 AM  ?  ?  Passed - Completed PHQ-2 or PHQ-9 in the last 360 days  ?  ?  Passed - Valid encounter within last 6 months  ?  Recent Outpatient Visits   ?      ? 4 weeks ago Annual physical exam  ? [facility] Jerrol Banana, MD  ? 2 months ago COVID  ? [facility] Jerrol Banana, MD  ? 4 months ago Anxiety and depression  ? [facility] Jerrol Banana, MD  ?  ?  ?Future Appointments   ?        ? In 5 days Ashley Royalty Ocie Bob, MD [facility]  ?  ? ?  ?  ?  ? ? ?

## 2021-05-18 ENCOUNTER — Other Ambulatory Visit: Payer: Self-pay

## 2021-05-18 MED ORDER — WEGOVY 2.4 MG/0.75ML ~~LOC~~ SOAJ: 2.4000 mg | SUBCUTANEOUS | 0 refills | Status: DC

## 2021-05-18 NOTE — Telephone Encounter (Signed)
Refill sent.  KP 

## 2021-05-18 NOTE — Telephone Encounter (Signed)
Pt is calling to let Dr. Jerolyn Center know that she takes her next [facility] 1.7 dose on Tuesday. And is wanting to increase the dose. Pt has he next follow up appt on Wednesday. Pt is wanting the next increase [facility] [address] [place], [place]. [phone] ?

## 2021-05-20 ENCOUNTER — Ambulatory Visit: Payer: 59

## 2021-05-20 ENCOUNTER — Encounter: Payer: Self-pay | Admitting: Family Medicine

## 2021-05-20 ENCOUNTER — Ambulatory Visit: Payer: BC Managed Care – PPO | Admitting: Family Medicine

## 2021-05-20 ENCOUNTER — Telehealth (INDEPENDENT_AMBULATORY_CARE_PROVIDER_SITE_OTHER): Payer: BC Managed Care – PPO | Admitting: Family Medicine

## 2021-05-20 DIAGNOSIS — Z6836 Body mass index (BMI) 36.0-36.9, adult: Secondary | ICD-10-CM

## 2021-05-20 DIAGNOSIS — I10 Essential (primary) hypertension: Secondary | ICD-10-CM | POA: Diagnosis not present

## 2021-05-20 MED ORDER — LISINOPRIL 10 MG PO TABS: 10.0000 mg | ORAL_TABLET | Freq: Every day | ORAL | 3 refills | Status: DC

## 2021-05-20 MED ORDER — WEGOVY 2.4 MG/0.75ML ~~LOC~~ SOAJ: 2.4000 mg | SUBCUTANEOUS | 1 refills | Status: DC

## 2021-05-20 NOTE — Assessment & Plan Note (Signed)
Noted over serial visits in the setting of mild hyperlipidemia and obesity.  She is asymptomatic from a cardiopulmonary standpoint.  That being said, to lower cardiovascular risk, she is amenable to initiation of lisinopril.  I did discuss the need for abstinence/contraception goal is medication and chosen abstinence.  Plan to have patient return in 2 months for reevaluation and to determine the need for further titration. ?

## 2021-05-20 NOTE — Assessment & Plan Note (Signed)
Chronic condition for which she has recently been able to titrate Wegovy to maximum dose of 2.4 mg.  While she is tolerating new dose and doses thus far well, she has had nausea without emesis throughout.  I did discuss the link to dietary modifications and that nausea may be present if lifestyle/dietary modifications have not been adequately made.  New Rx with new dose provided for, plan for an office visit in 2 months to reassess weight and symptoms. ?

## 2021-05-20 NOTE — Progress Notes (Signed)
?  ? ?Primary Care / Sports Medicine Virtual Visit ? ?Patient Information:  ?Patient ID: Kaitlyn Choi, female DOB: [birth year] Age: 41 y.o. MRN: [id number]  ? ?Kaitlyn Choi is a pleasant 41 y.o. female presenting with the following: ? ?Chief Complaint  ?Patient presents with  ? Follow-up  ?  Heart palpations- none since, Anxiety & depression, Wegovy haven't lost any since last visit. Stomach issues have resolved.   ? ? ?Review of Systems: No fevers, chills, night sweats, weight loss, chest pain, or shortness of breath.  ? ?Patient Active Problem List  ? Diagnosis Date Noted  ? Primary hypertension 05/20/2021  ? Right upper quadrant pain 04/16/2021  ? Annual physical exam 04/16/2021  ? COVID 03/12/2021  ? Class 2 severe obesity due to excess calories with serious comorbidity and body mass index (BMI) of 36.0 to 36.9 in adult [facility]) 03/12/2021  ? Anxiety and depression 12/17/2020  ? Flank pain 12/17/2020  ? Abnormal urinalysis 12/17/2020  ? Chronic right-sided low back pain without sciatica 12/17/2020  ? Asthma action plan declined 12/17/1983  ? ?Past Medical History:  ?Diagnosis Date  ? Anxiety   ? Asthma   ? Depression   ? Insomnia   ? ?Outpatient Encounter Medications as of 05/20/2021  ?Medication Sig  ? albuterol (VENTOLIN HFA) 108 (90 Base) MCG/ACT inhaler Inhale 1 puff into the lungs daily as needed.  ? citalopram (CELEXA) 10 MG tablet TAKE 1 TABLET BY MOUTH EVERY DAY  ? clobetasol (TEMOVATE) 0.05 % external solution Apply 1 application topically 2 (two) times daily.  ? diphenhydrAMINE (BENADRYL) 25 MG tablet Take 50 mg by mouth at bedtime as needed.  ? doxylamine, Sleep, (UNISOM) 25 MG tablet Take 25 mg by mouth at bedtime as needed for sleep.  ? EPINEPHrine (PRIMATENE MIST IN) Inhale 2 puffs into the lungs as needed.  ? lisinopril (ZESTRIL) 10 MG tablet Take 1 tablet (10 mg total) by mouth daily.  ? MELATONIN PO Take 12 mg by mouth at bedtime.  ? Vitamin D, Ergocalciferol, (DRISDOL) 1.25 MG (50000 UNIT) CAPS  capsule Take 1 capsule (50,000 Units total) by mouth every 7 (seven) days. Take for 8 total doses(weeks)  ? [DISCONTINUED] Semaglutide-Weight Management (WEGOVY) 2.4 MG/0.75ML SOAJ Inject 2.4 mg into the skin once a week.  ? Semaglutide-Weight Management (WEGOVY) 2.4 MG/0.75ML SOAJ Inject 2.4 mg into the skin once a week for 8 doses.  ? ?No facility-administered encounter medications on file as of 05/20/2021.  ? ?Past Surgical History:  ?Procedure Laterality Date  ? ANKLE FRACTURE SURGERY Right 12/2015  ? ? ?Virtual Visit via MyChart Video:  ? ?I connected with Kaitlyn Choi on 05/20/21 via MyChart Video and verified that I am speaking with the correct person using appropriate identifiers. ?  ?The limitations, risks, security and privacy concerns of performing an evaluation and management service by MyChart Video, including the higher likelihood of inaccurate diagnoses and treatments, and the availability of in person appointments were reviewed. The possible need of an additional face-to-face encounter for complete and high quality delivery of care was discussed. The patient was also made aware that there may be a patient responsible charge related to this service. The patient expressed understanding and wishes to proceed. ? ?Provider location is in medical facility. ?Patient location is at their home, different from provider location. ?People involved in care of the patient during this telehealth encounter were myself, my nurse/medical assistant, and my front office/scheduling team member. ? ?Objective findings:  ? ?General: Speaking full sentences,  no audible heavy breathing. Sounds alert and appropriately interactive. Well-appearing. Face symmetric. Extraocular movements intact. Pupils equal and round. No nasal flaring or accessory muscle use visualized. ? ?Independent interpretation of notes and tests performed by another provider:  ? ?None ? ?Pertinent History, Exam, Impression, and Recommendations:  ? ?Primary  hypertension ?Noted over serial visits in the setting of mild hyperlipidemia and obesity.  She is asymptomatic from a cardiopulmonary standpoint.  That being said, to lower cardiovascular risk, she is amenable to initiation of lisinopril.  I did discuss the need for abstinence/contraception goal is medication and chosen abstinence.  Plan to have patient return in 2 months for reevaluation and to determine the need for further titration. ? ?Class 2 severe obesity due to excess calories with serious comorbidity and body mass index (BMI) of 36.0 to 36.9 in adult [facility]) ?Chronic condition for which she has recently been able to titrate Wegovy to maximum dose of 2.4 mg.  While she is tolerating new dose and doses thus far well, she has had nausea without emesis throughout.  I did discuss the link to dietary modifications and that nausea may be present if lifestyle/dietary modifications have not been adequately made.  New Rx with new dose provided for, plan for an office visit in 2 months to reassess weight and symptoms. ? ?Orders & Medications ?Meds ordered this encounter  ?Medications  ? Semaglutide-Weight Management (WEGOVY) 2.4 MG/0.75ML SOAJ  ?  Sig: Inject 2.4 mg into the skin once a week for 8 doses.  ?  Dispense:  3 mL  ?  Refill:  1  ? lisinopril (ZESTRIL) 10 MG tablet  ?  Sig: Take 1 tablet (10 mg total) by mouth daily.  ?  Dispense:  30 tablet  ?  Refill:  3  ? ?No orders of the defined types were placed in this encounter. ?  ? ?I discussed the above assessment and treatment plan with the patient. The patient was provided an opportunity to ask questions and all were answered. The patient agreed with the plan and demonstrated an understanding of the instructions. ?  ?The patient was advised to call back or seek an in-person evaluation if the symptoms worsen or if the condition fails to improve as anticipated. ?  ?I provided a total time of 43 minutes including both face-to-face and non-face-to-face time on  05/20/2021 inclusive of time utilized for medical chart review, information gathering, care coordination with staff, and documentation completion.  Specifically, we reviewed pertinent labs, association risk factors such as ongoing obesity in light of current management options, and repeatedly noted hypertension with new need for medication management.  Medications were reviewed, associated need for abscess/contraception.  Questions were answered, appropriate follow-up discussed. ?  ? ?Jerrol Banana, MD ? ? Primary Care Sports Medicine ?[facility] ?[facility]  ? ?

## 2021-06-03 DIAGNOSIS — K219 Gastro-esophageal reflux disease without esophagitis: Secondary | ICD-10-CM | POA: Diagnosis not present

## 2021-06-03 DIAGNOSIS — R0683 Snoring: Secondary | ICD-10-CM | POA: Diagnosis not present

## 2021-06-15 ENCOUNTER — Other Ambulatory Visit: Payer: Self-pay | Admitting: Family Medicine

## 2021-06-15 DIAGNOSIS — R7989 Other specified abnormal findings of blood chemistry: Secondary | ICD-10-CM

## 2021-06-16 ENCOUNTER — Other Ambulatory Visit: Payer: Self-pay | Admitting: Family Medicine

## 2021-06-16 DIAGNOSIS — F419 Anxiety disorder, unspecified: Secondary | ICD-10-CM

## 2021-06-16 NOTE — Telephone Encounter (Signed)
Refilled 05/15/2021 #90 0 refills. ?Requested Prescriptions  ?Pending Prescriptions Disp Refills  ?? citalopram (CELEXA) 10 MG tablet [Pharmacy Med Name: CITALOPRAM HBR 10 MG TABLET] 90 tablet 0  ?  Sig: TAKE 1 TABLET BY MOUTH EVERY DAY  ?  ? Psychiatry:  Antidepressants - SSRI Passed - 06/16/2021  2:02 AM  ?  ?  Passed - Completed PHQ-2 or PHQ-9 in the last 360 days  ?  ?  Passed - Valid encounter within last 6 months  ?  Recent Outpatient Visits   ?      ? 3 weeks ago Primary hypertension  ? [facility] Montel Culver, MD  ? 2 months ago Annual physical exam  ? [facility] Montel Culver, MD  ? 3 months ago COVID  ? [facility] Montel Culver, MD  ? 6 months ago Anxiety and depression  ? [facility] Montel Culver, MD  ?  ?  ?Future Appointments   ?        ? In 1 month Zigmund Daniel Earley Abide, MD [facility], [place]  ?  ? ?  ?  ?  ? ?

## 2021-06-16 NOTE — Telephone Encounter (Signed)
Requested medication (s) are due for refill today: Yes ? ?Requested medication (s) are on the active medication list: Yes ? ?Last refill:  04/17/21 ? ?Future visit scheduled: Yes ? ?Notes to clinic:  Has pt. Completed treatment? ? ? ? ?Requested Prescriptions  ?Pending Prescriptions Disp Refills  ? Vitamin D, Ergocalciferol, (DRISDOL) 1.25 MG (50000 UNIT) CAPS capsule [Pharmacy Med Name: VITAMIN D2 1.25MG (50,000 UNIT)] 8 capsule 0  ?  Sig: Take 1 capsule (50,000 Units total) by mouth every 7 (seven) days. Take for 8 total doses(weeks)  ?  ? Endocrinology:  Vitamins - Vitamin D Supplementation 2 Failed - 06/15/2021  4:20 PM  ?  ?  Failed - Manual Review: Route requests for 50,000 IU strength to the provider  ?  ?  Failed - Vitamin D in normal range and within 360 days  ?  Vit D, 25-Hydroxy  ?Date Value Ref Range Status  ?04/16/2021 13.5 (L) 30.0 - 100.0 ng/mL Final  ?  Comment:  ?  Vitamin D deficiency has been defined by the Institute of ?Medicine and an Endocrine Society practice guideline as a ?level of serum 25-OH vitamin D less than 20 ng/mL (1,2). ?The Endocrine Society went on to further define vitamin D ?insufficiency as a level between 21 and 29 ng/mL (2). ?1. IOM (Institute of Medicine). 2010. Dietary reference ?   intakes for calcium and D. [place]: The ?   [employer]. ?2. Holick MF, Binkley [place], Bischoff-Ferrari HA, et al. ?   Evaluation, treatment, and prevention of vitamin D ?   deficiency: an Endocrine Society clinical practice ?   guideline. JCEM. 2011 Jul; 96(7):1911-30. ?  ?  ?  ?  ?  Passed - Ca in normal range and within 360 days  ?  Calcium  ?Date Value Ref Range Status  ?04/16/2021 9.0 8.7 - 10.2 mg/dL Final  ?  ?  ?  ?  Passed - Valid encounter within last 12 months  ?  Recent Outpatient Visits   ? ?      ? 3 weeks ago Primary hypertension  ? [facility] Montel Culver, MD  ? 2 months ago Annual physical exam  ? [facility] Montel Culver, MD  ? 3  months ago COVID  ? [facility] Montel Culver, MD  ? 6 months ago Anxiety and depression  ? [facility] Montel Culver, MD  ? ?  ?  ?Future Appointments   ? ?        ? In 1 month Zigmund Daniel Earley Abide, MD [facility], [place]  ? ?  ? ? ?  ?  ?  ? ?

## 2021-07-14 ENCOUNTER — Encounter: Payer: Self-pay | Admitting: Family Medicine

## 2021-07-14 ENCOUNTER — Other Ambulatory Visit: Payer: Self-pay

## 2021-07-14 MED ORDER — WEGOVY 2.4 MG/0.75ML ~~LOC~~ SOAJ: 2.4000 mg | SUBCUTANEOUS | 0 refills | Status: DC

## 2021-07-20 ENCOUNTER — Ambulatory Visit (INDEPENDENT_AMBULATORY_CARE_PROVIDER_SITE_OTHER): Payer: BC Managed Care – PPO | Admitting: Family Medicine

## 2021-07-20 ENCOUNTER — Encounter: Payer: Self-pay | Admitting: Family Medicine

## 2021-07-20 VITALS — BP 122/86 | HR 78 | Ht 67.0 in | Wt 218.8 lb

## 2021-07-20 DIAGNOSIS — F419 Anxiety disorder, unspecified: Secondary | ICD-10-CM

## 2021-07-20 DIAGNOSIS — F32A Depression, unspecified: Secondary | ICD-10-CM

## 2021-07-20 DIAGNOSIS — Z6836 Body mass index (BMI) 36.0-36.9, adult: Secondary | ICD-10-CM

## 2021-07-20 DIAGNOSIS — G4733 Obstructive sleep apnea (adult) (pediatric): Secondary | ICD-10-CM | POA: Diagnosis not present

## 2021-07-20 DIAGNOSIS — I10 Essential (primary) hypertension: Secondary | ICD-10-CM

## 2021-07-20 MED ORDER — LABETALOL HCL 100 MG PO TABS: 100.0000 mg | ORAL_TABLET | Freq: Two times a day (BID) | ORAL | 1 refills | Status: DC

## 2021-07-20 NOTE — Assessment & Plan Note (Signed)
Patient has demonstrated steady weight loss over the last visit where she was in our clinic, that being said there are dietary components that can be further optimized.  I have advised her in regards to exercise regimen, additionally a referral to nutritionist has been placed for their input.  We will discontinue Wegovy due to her stated desire for pregnancy in 2 months.

## 2021-07-20 NOTE — Assessment & Plan Note (Signed)
Patient's mood has been stable and well-controlled with current Celexa regimen, she brings up need for this in the setting of planned pregnancy in roughly 2 months.  We discussed the risks versus benefits and at this stage she will continue the medication.  I have placed a referral to psychiatry for nonpharmacologic management methods, additionally they can review medication adjustments as applicable.

## 2021-07-20 NOTE — Progress Notes (Signed)
     Primary Care / Sports Medicine Office Visit  Patient Information:  Patient ID: Kaitlyn Choi, female DOB: 10-28-1980 Age: 41 y.o. MRN: [id number]   Kaitlyn Choi is a pleasant 41 y.o. female presenting with the following:  Chief Complaint  Patient presents with   Hypertension   Weight Check    Vitals:   07/20/21 0826  BP: 122/86  Pulse: 78  SpO2: 98%   Vitals:   07/20/21 0826  Weight: 218 lb 12.8 oz (99.2 kg)  Height: 5\' 7"  (1.702 m)   Body mass index is 34.27 kg/m.  No results found.   Independent interpretation of notes and tests performed by another provider:   None  Procedures performed:   None  Pertinent History, Exam, Impression, and Recommendations:   Problem List Items Addressed This Visit       Cardiovascular and Mediastinum   Primary hypertension - Primary    Patient's blood pressure has responded very well to lisinopril, that being said that she brings up plan for pregnancy in roughly 2 months.  I have discussed further evaluation by cardiology, OB/GYN, and we will discontinue lisinopril and start her on twice daily labetalol over the interim.  She will return in 1 month for close follow-up to determine the need for medication titration while we await input from cardiology and OB/GYN.       Relevant Medications   labetalol (NORMODYNE) 100 MG tablet   Other Relevant Orders   Ambulatory referral to Cardiology     Other   Anxiety and depression    Patient's mood has been stable and well-controlled with current Celexa regimen, she brings up need for this in the setting of planned pregnancy in roughly 2 months.  We discussed the risks versus benefits and at this stage she will continue the medication.  I have placed a referral to psychiatry for nonpharmacologic management methods, additionally they can review medication adjustments as applicable.       Relevant Orders   Ambulatory referral to Psychiatry   Class 2 severe obesity due to excess  calories with serious comorbidity and body mass index (BMI) of 36.0 to 36.9 in adult [facility])    Patient has demonstrated steady weight loss over the last visit where she was in our clinic, that being said there are dietary components that can be further optimized.  I have advised her in regards to exercise regimen, additionally a referral to nutritionist has been placed for their input.  We will discontinue Wegovy due to her stated desire for pregnancy in 2 months.       Relevant Orders   Amb ref to Medical Nutrition Therapy-MNT     Orders & Medications Meds ordered this encounter  Medications   labetalol (NORMODYNE) 100 MG tablet    Sig: Take 1 tablet (100 mg total) by mouth 2 (two) times daily.    Dispense:  60 tablet    Refill:  1   Orders Placed This Encounter  Procedures   Ambulatory referral to Cardiology   Ambulatory referral to Psychiatry   Amb ref to Medical Nutrition Therapy-MNT     Return in about 4 weeks (around 08/17/2021).     08/19/2021, MD   Primary Care Sports Medicine [facility]

## 2021-07-20 NOTE — Assessment & Plan Note (Addendum)
Patient's blood pressure has responded very well to lisinopril, that being said that she brings up plan for pregnancy in roughly 2 months.  I have discussed further evaluation by cardiology, OB/GYN, and we will discontinue lisinopril and start her on twice daily labetalol over the interim.  She will return in 1 month for close follow-up to determine the need for medication titration while we await input from cardiology and OB/GYN.

## 2021-07-20 NOTE — Patient Instructions (Signed)
-   Referral coordinator will set up visits with cardiology, behavioral health, and nutritionist - Discontinue [phone] and lisinopril - Start labetalol - Continue prenatal vitamins - Contact OB/GYN to schedule appropriate visit - Return for follow-up in 1 month

## 2021-07-21 ENCOUNTER — Other Ambulatory Visit: Payer: Self-pay | Admitting: Family Medicine

## 2021-07-21 DIAGNOSIS — F32A Depression, unspecified: Secondary | ICD-10-CM

## 2021-07-22 NOTE — Telephone Encounter (Signed)
Patient called and advised of the refill request. She says she doesn't use that pharmacy. Advised to call and have them to stop sending the requests. She verbalized understanding. Will refuse due to this.   Requested Prescriptions  Pending Prescriptions Disp Refills   citalopram (CELEXA) 10 MG tablet [Pharmacy Med Name: CITALOPRAM HBR 10 MG TABLET] 90 tablet 0    Sig: TAKE 1 TABLET BY MOUTH EVERY DAY     Psychiatry:  Antidepressants - SSRI Passed - 07/21/2021  1:33 AM      Passed - Completed PHQ-2 or PHQ-9 in the last 360 days      Passed - Valid encounter within last 6 months    Recent Outpatient Visits           2 days ago Primary hypertension   [facility] Jerrol Banana, MD   2 months ago Primary hypertension   [facility] Jerrol Banana, MD   3 months ago Annual physical exam   [facility] Jerrol Banana, MD   4 months ago COVID   [facility] Jerrol Banana, MD   7 months ago Anxiety and depression   [facility] Jerrol Banana, MD       Future Appointments             In 1 month Ashley Royalty, Ocie Bob, MD [facility], [facility]   In 1 month Agbor-Etang, Arlys John, MD [facility], [facility]

## 2021-07-22 NOTE — Telephone Encounter (Signed)
Called pt and LMOM - Medication refill request is for a pharmacy in [place]. Need to verify that this is the correct pharmacy.

## 2021-08-11 ENCOUNTER — Other Ambulatory Visit: Payer: Self-pay | Admitting: Family Medicine

## 2021-08-11 DIAGNOSIS — I10 Essential (primary) hypertension: Secondary | ICD-10-CM

## 2021-08-12 NOTE — Telephone Encounter (Signed)
Refusing labetalol 100 mg tablet because requested too early and has upcoming f/u appt to see how she is responding to the mediction.

## 2021-08-15 ENCOUNTER — Other Ambulatory Visit: Payer: Self-pay | Admitting: Family Medicine

## 2021-08-15 DIAGNOSIS — F32A Depression, unspecified: Secondary | ICD-10-CM

## 2021-08-16 ENCOUNTER — Other Ambulatory Visit: Payer: Self-pay | Admitting: Family Medicine

## 2021-08-16 DIAGNOSIS — I10 Essential (primary) hypertension: Secondary | ICD-10-CM

## 2021-08-17 NOTE — Telephone Encounter (Signed)
Medication was discontinued 07/20/2021. Requested Prescriptions  Pending Prescriptions Disp Refills  . lisinopril (ZESTRIL) 10 MG tablet [Pharmacy Med Name: LISINOPRIL 10 MG TABLET] 90 tablet 1    Sig: TAKE 1 TABLET BY MOUTH EVERY DAY     Cardiovascular:  ACE Inhibitors Passed - 08/16/2021  9:15 AM      Passed - Cr in normal range and within 180 days    Creatinine, Ser  Date Value Ref Range Status  04/16/2021 0.97 0.57 - 1.00 mg/dL Final         Passed - K in normal range and within 180 days    Potassium  Date Value Ref Range Status  04/16/2021 4.2 3.5 - 5.2 mmol/L Final         Passed - Patient is not pregnant      Passed - Last BP in normal range    BP Readings from Last 1 Encounters:  07/20/21 122/86         Passed - Valid encounter within last 6 months    Recent Outpatient Visits          4 weeks ago Primary hypertension   [facility] Jerrol Banana, MD   2 months ago Primary hypertension   [facility] Jerrol Banana, MD   4 months ago Annual physical exam   [facility] Jerrol Banana, MD   5 months ago COVID   [facility] Jerrol Banana, MD   8 months ago Anxiety and depression   [facility] Jerrol Banana, MD      Future Appointments            In 1 week Ashley Royalty, Ocie Bob, MD [facility], [facility]   In 2 weeks Debbe Odea, MD [facility], [facility]

## 2021-08-17 NOTE — Telephone Encounter (Signed)
Requested Prescriptions  Pending Prescriptions Disp Refills  . citalopram (CELEXA) 10 MG tablet [Pharmacy Med Name: CITALOPRAM HBR 10 MG TABLET] 90 tablet 0    Sig: TAKE 1 TABLET BY MOUTH EVERY DAY     Psychiatry:  Antidepressants - SSRI Passed - 08/15/2021  9:08 AM      Passed - Completed PHQ-2 or PHQ-9 in the last 360 days      Passed - Valid encounter within last 6 months    Recent Outpatient Visits          4 weeks ago Primary hypertension   [facility] Jerrol Banana, MD   2 months ago Primary hypertension   [facility] Jerrol Banana, MD   4 months ago Annual physical exam   [facility] Jerrol Banana, MD   5 months ago COVID   [facility] Jerrol Banana, MD   8 months ago Anxiety and depression   [facility] Jerrol Banana, MD      Future Appointments            In 1 week Ashley Royalty, Ocie Bob, MD [facility], [facility]   In 2 weeks Debbe Odea, MD [facility], [facility]

## 2021-08-24 ENCOUNTER — Ambulatory Visit: Payer: BC Managed Care – PPO | Admitting: Family Medicine

## 2021-08-31 ENCOUNTER — Ambulatory Visit: Payer: BC Managed Care – PPO | Admitting: Cardiology

## 2021-08-31 ENCOUNTER — Ambulatory Visit: Payer: BC Managed Care – PPO | Admitting: Dietician

## 2021-09-03 ENCOUNTER — Other Ambulatory Visit: Payer: Self-pay | Admitting: Family Medicine

## 2021-09-03 DIAGNOSIS — I10 Essential (primary) hypertension: Secondary | ICD-10-CM

## 2021-09-03 DIAGNOSIS — F419 Anxiety disorder, unspecified: Secondary | ICD-10-CM

## 2021-09-03 NOTE — Telephone Encounter (Signed)
Call to patient- she has her Rx- does not need this from this pharmacy Requested Prescriptions  Pending Prescriptions Disp Refills  . citalopram (CELEXA) 10 MG tablet [Pharmacy Med Name: CITALOPRAM HBR 10 MG TABLET] 90 tablet 0    Sig: TAKE 1 TABLET BY MOUTH EVERY DAY     Psychiatry:  Antidepressants - SSRI Passed - 09/03/2021  1:35 AM      Passed - Completed PHQ-2 or PHQ-9 in the last 360 days      Passed - Valid encounter within last 6 months    Recent Outpatient Visits          1 month ago Primary hypertension   [facility] Jerrol Banana, MD   3 months ago Primary hypertension   [facility] Jerrol Banana, MD   4 months ago Annual physical exam   [facility] Jerrol Banana, MD   5 months ago COVID   [facility] Jerrol Banana, MD   8 months ago Anxiety and depression   [facility] Jerrol Banana, MD      Future Appointments            Tomorrow Jerrol Banana, MD [facility], [facility]   In 1 month End, Cristal Deer, MD [facility], [facility]

## 2021-09-03 NOTE — Telephone Encounter (Signed)
Lisinopril 10 mg refused because it was discontinued 07/20/2021.  Not on medication list.

## 2021-09-04 ENCOUNTER — Encounter: Payer: Self-pay | Admitting: Dietician

## 2021-09-04 ENCOUNTER — Encounter: Payer: BC Managed Care – PPO | Attending: Family Medicine | Admitting: Dietician

## 2021-09-04 ENCOUNTER — Ambulatory Visit (INDEPENDENT_AMBULATORY_CARE_PROVIDER_SITE_OTHER): Payer: BC Managed Care – PPO | Admitting: Family Medicine

## 2021-09-04 ENCOUNTER — Ambulatory Visit: Payer: BC Managed Care – PPO | Admitting: Family Medicine

## 2021-09-04 ENCOUNTER — Encounter: Payer: Self-pay | Admitting: Family Medicine

## 2021-09-04 VITALS — BP 138/78 | HR 70 | Ht 67.0 in | Wt 234.0 lb

## 2021-09-04 VITALS — Ht 67.0 in | Wt 234.0 lb

## 2021-09-04 DIAGNOSIS — K5900 Constipation, unspecified: Secondary | ICD-10-CM

## 2021-09-04 DIAGNOSIS — I10 Essential (primary) hypertension: Secondary | ICD-10-CM

## 2021-09-04 DIAGNOSIS — Z6836 Body mass index (BMI) 36.0-36.9, adult: Secondary | ICD-10-CM

## 2021-09-04 DIAGNOSIS — F32A Depression, unspecified: Secondary | ICD-10-CM

## 2021-09-04 DIAGNOSIS — H1012 Acute atopic conjunctivitis, left eye: Secondary | ICD-10-CM

## 2021-09-04 DIAGNOSIS — F419 Anxiety disorder, unspecified: Secondary | ICD-10-CM

## 2021-09-04 HISTORY — DX: Acute atopic conjunctivitis, left eye: H10.12

## 2021-09-04 HISTORY — DX: Constipation, unspecified: K59.00

## 2021-09-04 MED ORDER — POLYETHYLENE GLYCOL 3350 17 G PO PACK: 17.0000 g | PACK | Freq: Every day | ORAL | 11 refills | Status: DC

## 2021-09-04 MED ORDER — NIFEDIPINE ER OSMOTIC RELEASE 30 MG PO TB24: 30.0000 mg | ORAL_TABLET | Freq: Every day | ORAL | 1 refills | Status: DC

## 2021-09-04 MED ORDER — SENNA-DOCUSATE SODIUM 8.6-50 MG PO TABS: 1.0000 | ORAL_TABLET | Freq: Every day | ORAL | 0 refills | Status: DC | PRN

## 2021-09-04 NOTE — Assessment & Plan Note (Signed)
Chronic issue, does not have bowel movement in several days, recently reported nausea noted in the a.m. and afternoon.  She reports more recent bouts of sporadic loose stool.  Denies any specific correlation with food.  Abdominal exam reveals hypoactive bowel sounds, soft, mildly tender in the right lower quadrant without rebound, no hepatosplenomegaly.  Given her clinical history and presentation, concern for severe constipation, plan for initiation of MiraLAX regimen with titration guidelines, as needed senna-docusate, and lifestyle modification.  She does see nutrition group later today and have advised her to touch base with them in regards to this issue as well.

## 2021-09-04 NOTE — Assessment & Plan Note (Signed)
Patient reports interval improvement in mood, tolerating Celexa 10 mg well, has not been able to establish with psychiatry, new open-ended referral placed for her to establish with any group.

## 2021-09-04 NOTE — Assessment & Plan Note (Signed)
Few day history of left eye mild redness and thin drainage following housework including replacing overhead fixture.  Examination reveals benign oropharynx, nasal turbinates, tympanic membranes and canals, mild medial conjunctival erythema, no significant swelling of the upper or lower lids, nontender sinuses.  Clinical history and findings are most consistent with mild allergic conjunctivitis, OTC allergy eyedrops advised.

## 2021-09-04 NOTE — Patient Instructions (Signed)
-   Use OTC allergy eyedrops until symptoms resolve - Start nifedipine (blood pressure medication) daily -Start MiraLAX daily, can adjust dose every 3 days until at least once daily bowel movement without straining achieved - Can dose senna-docusate if no bowel movement after 3 days - Maintain follow-up with nutrition group, cardiology, and referral coordinator will contact you to schedule visit with psychiatry - Return for follow-up in 3 months

## 2021-09-04 NOTE — Assessment & Plan Note (Signed)
Patient is demonstrated interval improvement in her diastolic though increased systolic, plan to continue labetalol twice daily dosing, initiate nifedipine.  These agents have been selected due to patient's desire for pregnancy, does have follow-up with cardiology next month.

## 2021-09-04 NOTE — Progress Notes (Addendum)
Medical Nutrition Therapy: Visit start time: 1100  end time: 1155   Addendum: visit was conducted virtually with video and audio. Limitations of this type of visit were discussed, and patient voiced willingness to proceed. Provider was in private counseling room, patient at separate location during visit.  Assessment:   Referral Diagnosis: obesity Other medical history/ diagnoses: none significant Psychosocial issues/ stress concerns: none  Medications, supplements: reconciled list in medical record   Current weight: 234lbs Height: 5'7" BMI: 36.65 Patient's personal weight goal: 165-170lbs  Progress and evaluation:  Patient reports she was last at her goal weight about 11 years ago. She reports struggling with weight since college, and weight loss has become more difficult in recent years.  She attributes some weight gain to sedentary job and working from home for the past 7 years.  She has tried multiple diet plans in the past with short-term success only.  Food allergies: none known Special diet practices: occasional fasting for religious purposes, sometimes has consumed water only for several days, or sometimes has fasted for certain time periods in a day Patient seeks help with long term weight loss success and improving health risk    Dietary Intake:  Usual eating pattern includes 2-3 meals and 3 snacks per day. Dining out frequency: 1-3 meals per week. Who plans meals/ buys groceries? self Who prepares meals? self  Breakfast: usually very hungry when waking up, but waits until 10-11am to eat -- omelet; egg sandwich; bagel with cream cheese, sometimes adds fruit ie watermelon, cuties mandarin oranges Snack: cookie, cake, usually carbohydrate food Lunch: Ramen noodles, pizza Snack: same as am Supper: soup ie vegetable, or veg with chicken; chicken + rice + veg; restaurant meal 1-3x a week ie [place], [place]  Snack: often something sweet ie cake, cookies, candy, etc Beverages:  coffee with unsweetened vanilla almond milk; water, sparkling water; occasionally limeade recently  Physical activity: no regular activity at this time; has bought a treadmill for her home; has gym membership (new)   Intervention:   Nutrition Care Education:   Basic nutrition: basic food groups; appropriate nutrient balance; appropriate meal and snack schedule; general nutrition guidelines    Weight control: importance of low sugar and low fat choices; portion control strategies including using vegetables to stretch portions of starches; estimated energy needs for weight loss at 1400-1500 kcal, provided guidance for 45% CHO, 25% pro, 30% fat; discussed tracking food intake; managing food cravings; controlling hunger/ appetite; role of physical activity  Other intervention notes: Patient voices readiness to work on dietary and lifestyle changes; she feels she needs to focus on limiting and/or improving snack choices for evenings.  Established nutrition goals with direction from patient.   Nutritional Diagnosis:  Royal-3.3 Overweight/obesity As related to history of excess calories, inadequate physical activity.  As evidenced by patient with current BMI of 36.65.   Education Materials given:  Designer, industrial/product with food lists, sample meal pattern (by email) [employer] (by email) Visit summary with goals/ instructions to be viewed via patient portal   Learner/ who was taught:  Patient    Level of understanding: Verbalizes/ demonstrates competency  Demonstrated degree of understanding via:   Teach back Learning barriers: None  Willingness to learn/ readiness for change: Eager, change in progress   Monitoring and Evaluation:  Dietary intake, exercise, and body weight      follow up:  10/22/21 at 8:15am, virtual format

## 2021-09-04 NOTE — Progress Notes (Signed)
Primary Care / Sports Medicine Office Visit  Patient Information:  Patient ID: Kaitlyn Choi, female DOB: 08-05-1980 Age: 41 y.o. MRN: [id number]   Kaitlyn Choi is a pleasant 41 y.o. female presenting with the following:  Chief Complaint  Patient presents with   Hypertension    Vitals:   09/04/21 0850  BP: 138/78  Pulse: 70  SpO2: 98%   Vitals:   09/04/21 0850  Weight: 234 lb (106.1 kg)  Height: 5\' 7"  (1.702 m)   Body mass index is 36.65 kg/m.  No results found.   Independent interpretation of notes and tests performed by another provider:   None  Procedures performed:   None  Pertinent History, Exam, Impression, and Recommendations:   Problem List Items Addressed This Visit       Cardiovascular and Mediastinum   Primary hypertension - Primary    Patient is demonstrated interval improvement in her diastolic though increased systolic, plan to continue labetalol twice daily dosing, initiate nifedipine.  These agents have been selected due to patient's desire for pregnancy, does have follow-up with cardiology next month.      Relevant Medications   NIFEdipine (PROCARDIA-XL/NIFEDICAL-XL) 30 MG 24 hr tablet     Other   Anxiety and depression    Patient reports interval improvement in mood, tolerating Celexa 10 mg well, has not been able to establish with psychiatry, new open-ended referral placed for her to establish with any group.      Relevant Orders   Ambulatory referral to Psychiatry   Constipation    Chronic issue, does not have bowel movement in several days, recently reported nausea noted in the a.m. and afternoon.  She reports more recent bouts of sporadic loose stool.  Denies any specific correlation with food.  Abdominal exam reveals hypoactive bowel sounds, soft, mildly tender in the right lower quadrant without rebound, no hepatosplenomegaly.  Given her clinical history and presentation, concern for severe constipation, plan for initiation of  MiraLAX regimen with titration guidelines, as needed senna-docusate, and lifestyle modification.  She does see nutrition group later today and have advised her to touch base with them in regards to this issue as well.      Relevant Medications   polyethylene glycol (MIRALAX / GLYCOLAX) 17 g packet   sennosides-docusate sodium (SENOKOT-S) 8.6-50 MG tablet   Allergic conjunctivitis, left eye    Few day history of left eye mild redness and thin drainage following housework including replacing overhead fixture.  Examination reveals benign oropharynx, nasal turbinates, tympanic membranes and canals, mild medial conjunctival erythema, no significant swelling of the upper or lower lids, nontender sinuses.  Clinical history and findings are most consistent with mild allergic conjunctivitis, OTC allergy eyedrops advised.      I provided a total time of 42 minutes including both face-to-face and non-face-to-face time on 09/04/2021 inclusive of time utilized for medical chart review, information gathering, care coordination with staff, and documentation completion.  Specifically reviewed current symptomatology, upcoming referrals, specific interventions.    Orders & Medications Meds ordered this encounter  Medications   NIFEdipine (PROCARDIA-XL/NIFEDICAL-XL) 30 MG 24 hr tablet    Sig: Take 1 tablet (30 mg total) by mouth daily.    Dispense:  30 tablet    Refill:  1   polyethylene glycol (MIRALAX / GLYCOLAX) 17 g packet    Sig: Take 17 g by mouth daily. Adjust every 3 days until stooling regularly    Dispense:  30 packet    Refill:  11   sennosides-docusate sodium (SENOKOT-S) 8.6-50 MG tablet    Sig: Take 1 tablet by mouth daily as needed for constipation.    Dispense:  30 tablet    Refill:  0   Orders Placed This Encounter  Procedures   Ambulatory referral to Psychiatry     Return in about 3 months (around 12/05/2021).     Jerrol Banana, MD   Primary Care Sports Medicine [facility]

## 2021-09-04 NOTE — Patient Instructions (Signed)
Control nighttime hunger/ cravings by allowing a small healthy snack that contains protein and some carbohydrate. Consider lower sugar alternatives that might still satisfy a taste for something sweet -- such as graham crackers with small amount of peanut butter; fruit and yogurt; 1/4 - 1/2 cup trail mix without candy pieces; fruit with sugar free pudding, etc Plan to eat a lunch daily that includes some healthy protein along with low carb veggies and a small portion of a starch.  Use low carb veggies to stretch starch portions. Keep portions of starchy foods to 1 cup or less.

## 2021-09-10 ENCOUNTER — Encounter: Payer: Self-pay | Admitting: Family Medicine

## 2021-09-11 ENCOUNTER — Telehealth: Payer: Self-pay

## 2021-09-11 DIAGNOSIS — M25571 Pain in right ankle and joints of right foot: Secondary | ICD-10-CM | POA: Diagnosis not present

## 2021-09-11 DIAGNOSIS — S99921A Unspecified injury of right foot, initial encounter: Secondary | ICD-10-CM | POA: Diagnosis not present

## 2021-09-11 NOTE — Telephone Encounter (Signed)
Pt called, she was wanting order for xray placed for when she goes to [facility]. I advised pt that she would have to be seen there and then the provider who assesses her would place orders for xray if they feel appropriate. Pt was wanting to just go for imaging but I explained that she would need an actual visit. Pt verbalized understanding.

## 2021-09-20 ENCOUNTER — Other Ambulatory Visit: Payer: Self-pay | Admitting: Family Medicine

## 2021-09-20 DIAGNOSIS — I10 Essential (primary) hypertension: Secondary | ICD-10-CM

## 2021-09-22 NOTE — Telephone Encounter (Signed)
Refilled 09/04/2021 #30 1 refill. Requested Prescriptions  Pending Prescriptions Disp Refills  . NIFEdipine (PROCARDIA-XL/NIFEDICAL-XL) 30 MG 24 hr tablet [Pharmacy Med Name: NIFEDIPINE ER 30 MG TABLET] 90 tablet 1    Sig: TAKE 1 TABLET BY MOUTH EVERY DAY     Cardiovascular: Calcium Channel Blockers 2 Passed - 09/20/2021  2:09 PM      Passed - Last BP in normal range    BP Readings from Last 1 Encounters:  09/04/21 138/78         Passed - Last Heart Rate in normal range    Pulse Readings from Last 1 Encounters:  09/04/21 70         Passed - Valid encounter within last 6 months    Recent Outpatient Visits          2 weeks ago Primary hypertension   [facility] Jerrol Banana, MD   2 months ago Primary hypertension   [facility] Jerrol Banana, MD   4 months ago Primary hypertension   [facility] Jerrol Banana, MD   5 months ago Annual physical exam   [facility] Jerrol Banana, MD   6 months ago COVID   [facility] Jerrol Banana, MD      Future Appointments            In 4 weeks End, Cristal Deer, MD [facility], [facility]   In 1 month Darrick Grinder, RD [facility]   In 2 months Ashley Royalty, Ocie Bob, MD [facility], [facility]

## 2021-09-24 ENCOUNTER — Other Ambulatory Visit: Payer: Self-pay | Admitting: Family Medicine

## 2021-09-24 DIAGNOSIS — I10 Essential (primary) hypertension: Secondary | ICD-10-CM

## 2021-09-25 NOTE — Telephone Encounter (Signed)
Requested Prescriptions  Pending Prescriptions Disp Refills  . labetalol (NORMODYNE) 100 MG tablet [Pharmacy Med Name: LABETALOL HCL 100 MG TABLET] 180 tablet 0    Sig: TAKE 1 TABLET BY MOUTH TWICE A DAY     Cardiovascular:  Beta Blockers Passed - 09/24/2021 12:30 PM      Passed - Last BP in normal range    BP Readings from Last 1 Encounters:  09/04/21 138/78         Passed - Last Heart Rate in normal range    Pulse Readings from Last 1 Encounters:  09/04/21 70         Passed - Valid encounter within last 6 months    Recent Outpatient Visits          3 weeks ago Primary hypertension   [facility] Jerrol Banana, MD   2 months ago Primary hypertension   [facility] Jerrol Banana, MD   4 months ago Primary hypertension   [facility] Jerrol Banana, MD   5 months ago Annual physical exam   [facility] Jerrol Banana, MD   6 months ago COVID   [facility] Jerrol Banana, MD      Future Appointments            In 3 weeks End, Cristal Deer, MD [facility], [facility]   In 3 weeks Darrick Grinder, RD [facility] [place]   In 2 months Ashley Royalty, Ocie Bob, MD [facility], [facility]

## 2021-10-07 DIAGNOSIS — N912 Amenorrhea, unspecified: Secondary | ICD-10-CM | POA: Diagnosis not present

## 2021-10-14 ENCOUNTER — Other Ambulatory Visit: Payer: Self-pay | Admitting: Family Medicine

## 2021-10-14 DIAGNOSIS — I10 Essential (primary) hypertension: Secondary | ICD-10-CM

## 2021-10-14 DIAGNOSIS — F419 Anxiety disorder, unspecified: Secondary | ICD-10-CM

## 2021-10-14 NOTE — Telephone Encounter (Signed)
Refused lisinopril 10 mg refill request because it was discontinued 07/20/2021.

## 2021-10-14 NOTE — Telephone Encounter (Signed)
Requested Prescriptions  Pending Prescriptions Disp Refills  . citalopram (CELEXA) 10 MG tablet [Pharmacy Med Name: CITALOPRAM HBR 10 MG TABLET] 90 tablet 0    Sig: TAKE 1 TABLET BY MOUTH EVERY DAY     Psychiatry:  Antidepressants - SSRI Passed - 10/14/2021  1:33 AM      Passed - Completed PHQ-2 or PHQ-9 in the last 360 days      Passed - Valid encounter within last 6 months    Recent Outpatient Visits          1 month ago Primary hypertension   [place] [facility], Ocie Bob, MD   2 months ago Primary hypertension   [place] [facility], Ocie Bob, MD   4 months ago Primary hypertension   [place] [facility], Ocie Bob, MD   6 months ago Annual physical exam   [facility] and [facility] at [facility], Ocie Bob, MD   7 months ago COVID   [facility] Medicine at [facility], Ocie Bob, MD      Future Appointments            In 1 week End, Cristal Deer, MD [facility], [facility]   In 1 week Darrick Grinder, RD [facility]   In 1 month Ashley Royalty, Ocie Bob, MD [facility] Primary Care and Sports Medicine at [facility], [facility]

## 2021-10-21 ENCOUNTER — Encounter: Payer: Self-pay | Admitting: Internal Medicine

## 2021-10-21 ENCOUNTER — Ambulatory Visit: Payer: BC Managed Care – PPO | Admitting: Internal Medicine

## 2021-10-21 DIAGNOSIS — I10 Essential (primary) hypertension: Secondary | ICD-10-CM | POA: Diagnosis not present

## 2021-10-21 DIAGNOSIS — F32A Depression, unspecified: Secondary | ICD-10-CM | POA: Diagnosis not present

## 2021-10-21 DIAGNOSIS — J45909 Unspecified asthma, uncomplicated: Secondary | ICD-10-CM | POA: Diagnosis not present

## 2021-10-21 DIAGNOSIS — Z3200 Encounter for pregnancy test, result unknown: Secondary | ICD-10-CM | POA: Diagnosis not present

## 2021-10-21 NOTE — Progress Notes (Deleted)
New Outpatient Visit Date: 10/21/2021  Referring Provider: Jerrol Banana, MD [address] [place],  [place] [address]  Chief Complaint: ***  HPI:  Ms. Starace is a 41 y.o. female who is being seen today for the evaluation of hypertension at the request of Dr. Ashley Royalty. She has a history of hypertension, asthma, anxiety, and depression.  She last saw Dr. Ashley Royalty in early July, which time her blood pressure was borderline elevated.  She was continued on labetalol.  Nifedipine was also added.  These agents were selected due to Ms. Arvie's desire to become pregnant.  --------------------------------------------------------------------------------------------------  Cardiovascular History & Procedures: Cardiovascular Problems: Hypertension  Risk Factors: Hypertension and obesity  Cath/PCI: None  CV Surgery: None  EP Procedures and Devices: None  Non-Invasive Evaluation(s): None  Recent CV Pertinent Labs: Lab Results  Component Value Date   CHOL 144 04/16/2021   HDL 81 04/16/2021   LDLCALC 43 04/16/2021   TRIG 118 04/16/2021   CHOLHDL 1.8 04/16/2021   K 4.2 04/16/2021   BUN 9 04/16/2021   CREATININE 0.97 04/16/2021    --------------------------------------------------------------------------------------------------  Past Medical History:  Diagnosis Date   Anxiety    Asthma    Depression    Hypertension    Insomnia     Past Surgical History:  Procedure Laterality Date   ANKLE FRACTURE SURGERY Right 12/2015    No outpatient medications have been marked as taking for the 10/21/21 encounter (Appointment) with [facility], Cristal Deer, MD.    Allergies: Latex  Social History   Tobacco Use   Smoking status: Never   Smokeless tobacco: Never  Vaping Use   Vaping Use: Never used  Substance Use Topics   Alcohol use: Not Currently    Alcohol/week: 5.0 standard drinks of alcohol    Types: 5 Standard drinks or equivalent per week   Drug use: Never     Family History  Problem Relation Age of Onset   Stroke Father    Lung cancer Maternal Grandmother    Sudden Cardiac Death Maternal Grandfather    Hypertension Paternal Grandmother    Kidney failure Paternal Grandmother    Breast cancer Paternal Grandmother    Stroke Paternal Grandfather     Review of Systems: A 12-system review of systems was performed and was negative except as noted in the HPI.  --------------------------------------------------------------------------------------------------  Physical Exam: There were no vitals taken for this visit.  General:  *** HEENT: No conjunctival pallor or scleral icterus. Facemask in place. Neck: Supple without lymphadenopathy, thyromegaly, JVD, or HJR. No carotid bruit. Lungs: Normal work of breathing. Clear to auscultation bilaterally without wheezes or crackles. Heart: Regular rate and rhythm without murmurs, rubs, or gallops. Non-displaced PMI. Abd: Bowel sounds present. Soft, NT/ND without hepatosplenomegaly Ext: No lower extremity edema. Radial, PT, and DP pulses are 2+ bilaterally Skin: Warm and dry without rash. Neuro: CNIII-XII intact. Strength and fine-touch sensation intact in upper and lower extremities bilaterally. Psych: Normal mood and affect.  EKG:  ***  Lab Results  Component Value Date   WBC 5.7 04/16/2021   HGB 13.3 04/16/2021   HCT 41.2 04/16/2021   MCV 88 04/16/2021   PLT 346 04/16/2021    Lab Results  Component Value Date   NA 137 04/16/2021   K 4.2 04/16/2021   CL 99 04/16/2021   CO2 21 04/16/2021   BUN 9 04/16/2021   CREATININE 0.97 04/16/2021   GLUCOSE 101 (H) 04/16/2021   ALT 27 04/16/2021    Lab  Results  Component Value Date   CHOL 144 04/16/2021   HDL 81 04/16/2021   LDLCALC 43 04/16/2021   TRIG 118 04/16/2021   CHOLHDL 1.8 04/16/2021     --------------------------------------------------------------------------------------------------  ASSESSMENT AND  PLAN: Yvonne Kendall, MD 10/21/2021 7:31 AM

## 2021-10-22 ENCOUNTER — Encounter: Payer: Self-pay | Admitting: Family Medicine

## 2021-10-22 ENCOUNTER — Telehealth: Payer: BC Managed Care – PPO | Admitting: Family Medicine

## 2021-10-22 ENCOUNTER — Telehealth: Payer: BC Managed Care – PPO | Admitting: Dietician

## 2021-10-30 DIAGNOSIS — O09521 Supervision of elderly multigravida, first trimester: Secondary | ICD-10-CM | POA: Diagnosis not present

## 2021-10-30 DIAGNOSIS — Z113 Encounter for screening for infections with a predominantly sexual mode of transmission: Secondary | ICD-10-CM | POA: Diagnosis not present

## 2021-11-04 ENCOUNTER — Telehealth: Payer: BC Managed Care – PPO | Admitting: Dietician

## 2021-11-16 DIAGNOSIS — O209 Hemorrhage in early pregnancy, unspecified: Secondary | ICD-10-CM | POA: Diagnosis not present

## 2021-11-17 DIAGNOSIS — O021 Missed abortion: Secondary | ICD-10-CM | POA: Diagnosis not present

## 2021-11-19 ENCOUNTER — Other Ambulatory Visit: Payer: Self-pay | Admitting: Family Medicine

## 2021-11-19 DIAGNOSIS — F32A Depression, unspecified: Secondary | ICD-10-CM

## 2021-11-19 NOTE — Telephone Encounter (Signed)
Last refill date doc. 08/17/21. Requested Prescriptions  Pending Prescriptions Disp Refills  . citalopram (CELEXA) 10 MG tablet [Pharmacy Med Name: CITALOPRAM HBR 10 MG TABLET] 90 tablet 0    Sig: TAKE 1 TABLET BY MOUTH EVERY DAY     Psychiatry:  Antidepressants - SSRI Passed - 11/19/2021  2:08 AM      Passed - Completed PHQ-2 or PHQ-9 in the last 360 days      Passed - Valid encounter within last 6 months    Recent Outpatient Visits          2 months ago Primary hypertension   [place] [facility] at [place], Earley Abide, MD   4 months ago Primary hypertension   [place] [facility] at [place], Earley Abide, MD   6 months ago Primary hypertension   [place] [facility] at [facility], Earley Abide, MD   7 months ago Annual physical exam   [facility] Care and [facility] at [facility], Earley Abide, MD   8 months ago [place] [facility] at [place], MD      Future Appointments            In 1 week Berniece Salines, DO [place]   In 2 weeks Zigmund Daniel, Earley Abide, MD [facility] Primary Care and Sports Medicine at [facility], [facility]

## 2021-11-21 ENCOUNTER — Ambulatory Visit
Admission: RE | Admit: 2021-11-21 | Discharge: 2021-11-21 | Disposition: A | Discharge status: Home / Self Care | Payer: BC Managed Care – PPO | Source: Ambulatory Visit | Attending: Emergency Medicine | Admitting: Emergency Medicine

## 2021-11-21 VITALS — BP 125/73 | HR 77 | Temp 98.3°F | Resp 18

## 2021-11-21 DIAGNOSIS — J4521 Mild intermittent asthma with (acute) exacerbation: Secondary | ICD-10-CM

## 2021-11-21 MED ORDER — ALBUTEROL SULFATE HFA 108 (90 BASE) MCG/ACT IN AERS: 2.0000 | INHALATION_SPRAY | Freq: Four times a day (QID) | RESPIRATORY_TRACT | 2 refills | Status: DC | PRN

## 2021-11-21 MED ORDER — ALBUTEROL SULFATE 1.25 MG/3ML IN NEBU: 1.0000 | INHALATION_SOLUTION | Freq: Four times a day (QID) | RESPIRATORY_TRACT | 0 refills | Status: DC | PRN

## 2021-11-21 NOTE — ED Triage Notes (Addendum)
Pt states she has a history of asthma. She reports taking a she had a miscarriage and took an abortion pill on Thursday.   The patient states last night she was experiencing wheezing that did not subside with primetine mist.  The patient states she needs a refill for an albuterol inhaler and albuterol neb solution.  Patient does not display signs/ symptoms of respiratory distress.

## 2021-11-21 NOTE — ED Provider Notes (Signed)
[facility]    CSN: [id number] Arrival date & time: 11/21/21  1312    HISTORY   Chief Complaint  Patient presents with   Wheezing    I have asthma. I had a miscarriage and took the abortion pill on Thursday. Last night I was experiencing heavy wheezing that did not subside with primetine mist. - Entered by patient   HPI Kaitlyn Choi is a pleasant, 41 y.o. female who presents to urgent care today. Patient reports a history of mild intermittent asthma, states currently using Primatene Mist for mild symptoms, has not had a prescription for albuterol in "years".  Patient states she feels like her breathing has become heavy and she has heard herself wheezing.  Patient states she would like to have a renewal for her albuterol.  Patient endorses allergic rhinitis as well, states has some Flonase at home but is not currently using.  The history is provided by the patient.   Past Medical History:  Diagnosis Date   Anxiety    Asthma    Depression    Hypertension    Insomnia    Patient Active Problem List   Diagnosis Date Noted   Constipation 09/04/2021   Allergic conjunctivitis, left eye 09/04/2021   Primary hypertension 05/20/2021   Right upper quadrant pain 04/16/2021   Annual physical exam 04/16/2021   COVID 03/12/2021   Class 2 severe obesity due to excess calories with serious comorbidity and body mass index (BMI) of 36.0 to 36.9 in adult [facility]) 03/12/2021   Anxiety and depression 12/17/2020   Flank pain 12/17/2020   Abnormal urinalysis 12/17/2020   Chronic right-sided low back pain without sciatica 12/17/2020   Asthma action plan declined 12/17/1983   Past Surgical History:  Procedure Laterality Date   ANKLE FRACTURE SURGERY Right 12/2015   OB History     Gravida  0   Para  0   Term  0   Preterm  0   AB  0   Living  0      SAB  0   IAB  0   Ectopic  0   Multiple  0   Live Births  0          Home Medications    Prior to Admission  medications   Medication Sig Start Date End Date Taking? Authorizing Provider  albuterol (ACCUNEB) 1.25 MG/3ML nebulizer solution Take 3 mLs (1.25 mg total) by nebulization every 6 (six) hours as needed for wheezing. 11/21/21 12/21/21 Yes Lynden Oxford Scales, PA-C  albuterol (VENTOLIN HFA) 108 (90 Base) MCG/ACT inhaler Inhale 2 puffs into the lungs every 6 (six) hours as needed for wheezing or shortness of breath (Cough). 11/21/21  Yes Lynden Oxford Scales, PA-C  citalopram (CELEXA) 10 MG tablet TAKE 1 TABLET BY MOUTH EVERY DAY 11/19/21   Montel Culver, MD  clobetasol (TEMOVATE) 0.05 % external solution Apply 1 application topically 2 (two) times daily. 12/09/20   [provider]  diphenhydrAMINE (BENADRYL) 25 MG tablet Take 50 mg by mouth at bedtime as needed.    [provider]  doxylamine, Sleep, (UNISOM) 25 MG tablet Take 25 mg by mouth at bedtime as needed for sleep.    [provider]  labetalol (NORMODYNE) 100 MG tablet TAKE 1 TABLET BY MOUTH TWICE A DAY 09/25/21   Montel Culver, MD  MELATONIN PO Take 12 mg by mouth at bedtime.    [provider]  NIFEdipine (PROCARDIA-XL/NIFEDICAL-XL) 30 MG 24 hr  tablet Take 1 tablet (30 mg total) by mouth daily. 09/04/21   Montel Culver, MD  polyethylene glycol (MIRALAX / GLYCOLAX) 17 g packet Take 17 g by mouth daily. Adjust every 3 days until stooling regularly 09/04/21   Montel Culver, MD  sennosides-docusate sodium (SENOKOT-S) 8.6-50 MG tablet Take 1 tablet by mouth daily as needed for constipation. 09/04/21   Montel Culver, MD    Family History Family History  Problem Relation Age of Onset   Stroke Father    Lung cancer Maternal Grandmother    Sudden Cardiac Death Maternal Grandfather    Hypertension Paternal Grandmother    Kidney failure Paternal Grandmother    Breast cancer Paternal Grandmother    Stroke Paternal Grandfather    Social History Social History   Tobacco Use   Smoking  status: Never   Smokeless tobacco: Never  Vaping Use   Vaping Use: Never used  Substance Use Topics   Alcohol use: Not Currently    Alcohol/week: 5.0 standard drinks of alcohol    Types: 5 Standard drinks or equivalent per week   Drug use: Never   Allergies   Latex  Review of Systems Review of Systems Pertinent findings revealed after performing a 14 point review of systems has been noted in the history of present illness.  Physical Exam Triage Vital Signs ED Triage Vitals  Enc Vitals Group     BP 12/26/20 0827 (!) 147/82     Pulse Rate 12/26/20 0827 72     Resp 12/26/20 0827 18     Temp 12/26/20 0827 98.3 F (36.8 C)     Temp Source 12/26/20 0827 Oral     SpO2 12/26/20 0827 98 %     Weight --      Height --      Head Circumference --      Peak Flow --      Pain Score 12/26/20 0826 5     Pain Loc --      Pain Edu? --      Excl. in [place]? --   No data found.  Updated Vital Signs BP 125/73 (BP Location: Right Arm)   Pulse 77   Temp 98.3 F (36.8 C) (Oral)   Resp 18   SpO2 97%   Physical Exam Vitals and nursing note reviewed.  Constitutional:      General: She is not in acute distress.    Appearance: Normal appearance. She is not ill-appearing.  HENT:     Head: Normocephalic and atraumatic.     Salivary Glands: Right salivary gland is not diffusely enlarged or tender. Left salivary gland is not diffusely enlarged or tender.     Right Ear: Ear canal and external ear normal. No drainage. There is no impacted cerumen. Tympanic membrane is bulging. Tympanic membrane is not injected or erythematous.     Left Ear: Ear canal and external ear normal. No drainage. There is no impacted cerumen. Tympanic membrane is bulging. Tympanic membrane is not injected or erythematous.     Ears:     Comments: Bilateral EACs normal, both TMs bulging with clear fluid    Nose: Rhinorrhea present. No nasal deformity, septal deviation, signs of injury, nasal tenderness, mucosal edema or  congestion. Rhinorrhea is clear.     Right Nostril: Occlusion present. No foreign body, epistaxis or septal hematoma.     Left Nostril: Occlusion present. No foreign body, epistaxis or septal hematoma.     Right Turbinates: Enlarged  and pale. Not swollen.     Left Turbinates: Enlarged and pale. Not swollen.     Right Sinus: No maxillary sinus tenderness or frontal sinus tenderness.     Left Sinus: No maxillary sinus tenderness or frontal sinus tenderness.     Mouth/Throat:     Lips: Pink. No lesions.     Mouth: Mucous membranes are moist. No oral lesions.     Pharynx: Oropharynx is clear. Uvula midline. No posterior oropharyngeal erythema or uvula swelling.     Tonsils: No tonsillar exudate. 0 on the right. 0 on the left.     Comments: Postnasal drip Eyes:     General: Lids are normal.        Right eye: No discharge.        Left eye: No discharge.     Extraocular Movements: Extraocular movements intact.     Conjunctiva/sclera: Conjunctivae normal.     Right eye: Right conjunctiva is not injected.     Left eye: Left conjunctiva is not injected.  Neck:     Trachea: Trachea and phonation normal.  Cardiovascular:     Rate and Rhythm: Normal rate and regular rhythm.     Pulses: Normal pulses.     Heart sounds: Normal heart sounds. No murmur heard.    No friction rub. No gallop.  Pulmonary:     Effort: Pulmonary effort is normal. No tachypnea, bradypnea, accessory muscle usage, prolonged expiration, respiratory distress or retractions.     Breath sounds: Normal air entry. No stridor, decreased air movement or transmitted upper airway sounds. Examination of the right-lower field reveals wheezing. Examination of the left-lower field reveals wheezing. Decreased breath sounds and wheezing present. No rhonchi or rales.  Chest:     Chest wall: No tenderness.  Musculoskeletal:        General: Normal range of motion.     Cervical back: Normal range of motion and neck supple. Normal range of  motion.  Lymphadenopathy:     Cervical: No cervical adenopathy.  Skin:    General: Skin is warm and dry.     Findings: No erythema or rash.  Neurological:     General: No focal deficit present.     Mental Status: She is alert and oriented to person, place, and time.  Psychiatric:        Mood and Affect: Mood normal.        Behavior: Behavior normal.     Visual Acuity Right Eye Distance:   Left Eye Distance:   Bilateral Distance:    Right Eye Near:   Left Eye Near:    Bilateral Near:     UC Couse / Diagnostics / Procedures:     Radiology No results found.  Procedures Procedures (including critical care time) EKG  Pending results:  Labs Reviewed - No data to display  Medications Ordered in UC: Medications - No data to display  UC Diagnoses / Final Clinical Impressions(s)   I have reviewed the triage vital signs and the nursing notes.  Pertinent labs & imaging results that were available during my care of the patient were reviewed by me and considered in my medical decision making (see chart for details).    Final diagnoses:  Mild intermittent asthma with (acute) exacerbation   Patient had normal vital signs on arrival today, was in no acute distress.  Viral testing not indicated.  Steroids not indicated due to very mild wheezing appreciated at end expiratory breath sounds.  Albuterol nebs  and HFA renewed at patient's request.  Patient advised to resume allergy medications for the short-term.  Return precautions advised.  ED Prescriptions     Medication Sig Dispense Auth. Provider   albuterol (VENTOLIN HFA) 108 (90 Base) MCG/ACT inhaler Inhale 2 puffs into the lungs every 6 (six) hours as needed for wheezing or shortness of breath (Cough). 18 g Lynden Oxford Scales, PA-C   albuterol (ACCUNEB) 1.25 MG/3ML nebulizer solution Take 3 mLs (1.25 mg total) by nebulization every 6 (six) hours as needed for wheezing. 360 mL Lynden Oxford Scales, PA-C      PDMP not  reviewed this encounter.  Disposition Upon Discharge:  Condition: stable for discharge home Home: take medications as prescribed; routine discharge instructions as discussed; follow up as advised.  Patient presented with an acute illness with associated systemic symptoms and significant discomfort requiring urgent management. In my opinion, this is a condition that a prudent lay person (someone who possesses an average knowledge of health and medicine) may potentially expect to result in complications if not addressed urgently such as respiratory distress, impairment of bodily function or dysfunction of bodily organs.   Routine symptom specific, illness specific and/or disease specific instructions were discussed with the patient and/or caregiver at length.   As such, the patient has been evaluated and assessed, work-up was performed and treatment was provided in alignment with urgent care protocols and evidence based medicine.  Patient/parent/caregiver has been advised that the patient may require follow up for further testing and treatment if the symptoms continue in spite of treatment, as clinically indicated and appropriate.  If the patient was tested for COVID-19, Influenza and/or RSV, then the patient/parent/guardian was advised to isolate at home pending the results of his/her diagnostic coronavirus test and potentially longer if they're positive. I have also advised pt that if his/her COVID-19 test returns positive, it's recommended to self-isolate for at least 10 days after symptoms first appeared AND until fever-free for 24 hours without fever reducer AND other symptoms have improved or resolved. Discussed self-isolation recommendations as well as instructions for household member/close contacts as per the [facility] and [place] DHHS, and also gave patient the [place] packet with this information.  Patient/parent/caregiver has been advised to return to the [facility] or PCP in 3-5 days if no better; to PCP or the  Emergency Department if new signs and symptoms develop, or if the current signs or symptoms continue to change or worsen for further workup, evaluation and treatment as clinically indicated and appropriate  The patient will follow up with their current PCP if and as advised. If the patient does not currently have a PCP we will assist them in obtaining one.   The patient may need specialty follow up if the symptoms continue, in spite of conservative treatment and management, for further workup, evaluation, consultation and treatment as clinically indicated and appropriate.  Patient/parent/caregiver verbalized understanding and agreement of plan as discussed.  All questions were addressed during visit.  Please see discharge instructions below for further details of plan.  Discharge Instructions:   Discharge Instructions      I have sent renewals for your albuterol to your pharmacy, both the actuator inhaler as well as the nebulizer solution.  For the next several days I also recommend that you use albuterol, in either form, 4 times daily to keep your lungs calm.  Please do consider taking Flonase and an allergy tablet such as Allegra or Zyrtec for the next few days until your lower airways  are more calm.    Please let us know if there is anything else we can do for you.  Thank you for visiting urgent care.    This office note has been dictated using Museum/gallery curator.  Unfortunately, this method of dictation can sometimes lead to typographical or grammatical errors.  I apologize for your inconvenience in advance if this occurs.  Please do not hesitate to reach out to me if clarification is needed.      Lynden Oxford Scales, PA-C 11/21/21 1355

## 2021-11-21 NOTE — Discharge Instructions (Addendum)
I have sent renewals for your albuterol to your pharmacy, both the actuator inhaler as well as the nebulizer solution.  For the next several days I also recommend that you use albuterol, in either form, 4 times daily to keep your lungs calm.  Please do consider taking Flonase and an allergy tablet such as Allegra or Zyrtec for the next few days until your lower airways are more calm.    Please let us know if there is anything else we can do for you.  Thank you for visiting urgent care.

## 2021-12-01 ENCOUNTER — Ambulatory Visit: Payer: BC Managed Care – PPO | Attending: Cardiology | Admitting: Cardiology

## 2021-12-07 ENCOUNTER — Ambulatory Visit: Payer: BC Managed Care – PPO | Admitting: Family Medicine

## 2021-12-10 ENCOUNTER — Encounter: Payer: Self-pay | Admitting: Family Medicine

## 2021-12-11 ENCOUNTER — Other Ambulatory Visit: Payer: Self-pay | Admitting: Family Medicine

## 2021-12-11 DIAGNOSIS — I10 Essential (primary) hypertension: Secondary | ICD-10-CM

## 2021-12-11 NOTE — Telephone Encounter (Signed)
Requested medications are due for refill today.  yes  Requested medications are on the active medications list.  yes  Last refill. 09/04/2021 #30 1 rf  Future visit scheduled.   no  Notes to clinic.  Pharmacy needs dx code. PT requesting a 90 day supply.    Requested Prescriptions  Pending Prescriptions Disp Refills   NIFEdipine (PROCARDIA-XL/NIFEDICAL-XL) 30 MG 24 hr tablet [Pharmacy Med Name: NIFEDIPINE ER 30 MG TABLET] 90 tablet 1    Sig: TAKE 1 TABLET BY MOUTH EVERY DAY     Cardiovascular: Calcium Channel Blockers 2 Passed - 12/11/2021 12:30 PM      Passed - Last BP in normal range    BP Readings from Last 1 Encounters:  11/21/21 125/73         Passed - Last Heart Rate in normal range    Pulse Readings from Last 1 Encounters:  11/21/21 77         Passed - Valid encounter within last 6 months    Recent Outpatient Visits           3 months ago Primary hypertension   [place] [facility] [place], Earley Abide, MD   4 months ago Primary hypertension   [place] [facility] at [facility], Earley Abide, MD   6 months ago Primary hypertension   [place] [facility] at [facility], Earley Abide, MD   7 months ago Annual physical exam   [facility] at [facility], Earley Abide, MD   9 months ago [place] [facility] at [facility], Earley Abide, MD

## 2022-01-01 ENCOUNTER — Encounter: Payer: Self-pay | Admitting: Dietician

## 2022-01-01 NOTE — Progress Notes (Signed)
Have not heard back from patient to reschedule her second missed appointment from 11/04/21. Sent notification to referring provider.

## 2022-01-07 ENCOUNTER — Other Ambulatory Visit: Payer: Self-pay | Admitting: Family Medicine

## 2022-01-07 DIAGNOSIS — I10 Essential (primary) hypertension: Secondary | ICD-10-CM

## 2022-01-07 NOTE — Telephone Encounter (Signed)
Requested Prescriptions  Pending Prescriptions Disp Refills   labetalol (NORMODYNE) 100 MG tablet [Pharmacy Med Name: LABETALOL HCL 100 MG TABLET] 180 tablet 0    Sig: TAKE 1 TABLET BY MOUTH TWICE A DAY     Cardiovascular:  Beta Blockers Passed - 01/07/2022  2:28 AM      Passed - Last BP in normal range    BP Readings from Last 1 Encounters:  11/21/21 125/73         Passed - Last Heart Rate in normal range    Pulse Readings from Last 1 Encounters:  11/21/21 77         Passed - Valid encounter within last 6 months    Recent Outpatient Visits           4 months ago Primary hypertension   [place] [facility], Ocie Bob, MD   5 months ago Primary hypertension   [place] [facility] at [facility], Ocie Bob, MD   7 months ago Primary hypertension   [place] [facility] at [facility], Ocie Bob, MD   8 months ago Annual physical exam   [facility] at [facility], Ocie Bob, MD   10 months ago COVID   [facility] at [facility], Ocie Bob, MD

## 2022-01-27 DIAGNOSIS — J301 Allergic rhinitis due to pollen: Secondary | ICD-10-CM | POA: Diagnosis not present

## 2022-02-12 DIAGNOSIS — J301 Allergic rhinitis due to pollen: Secondary | ICD-10-CM | POA: Diagnosis not present

## 2022-03-01 NOTE — L&D Delivery Note (Signed)
Delivery Note At 10:36 PM a viable and healthy female was delivered via Vaginal, Spontaneous (Presentation:   Occiput Anterior).  APGAR: 4, 9; weight  pending.   Placenta status: Spontaneous;Pathology, Intact.  Cord:  can x 1 tight clamped and cut3 vessels with the following complications:  .  Cord pH: n/a  Anesthesia: Epidural Episiotomy: None Lacerations: 2nd degree perineal Suture Repair: 3.0 chromic Est. Blood Loss (mL):    Mom to postpartum.  Baby to Couplet care / Skin to Skin.  Kaitlyn Choi A Chanta Bauers 12/27/2022, 11:23 PM

## 2022-03-08 ENCOUNTER — Encounter (HOSPITAL_BASED_OUTPATIENT_CLINIC_OR_DEPARTMENT_OTHER): Payer: Self-pay | Admitting: Cardiovascular Disease

## 2022-03-08 ENCOUNTER — Ambulatory Visit (INDEPENDENT_AMBULATORY_CARE_PROVIDER_SITE_OTHER): Payer: BC Managed Care – PPO | Admitting: Cardiovascular Disease

## 2022-03-08 VITALS — BP 112/82 | HR 91 | Ht 67.0 in | Wt 254.3 lb

## 2022-03-08 DIAGNOSIS — I10 Essential (primary) hypertension: Secondary | ICD-10-CM

## 2022-03-08 MED ORDER — NIFEDIPINE ER OSMOTIC RELEASE 30 MG PO TB24: 30.0000 mg | ORAL_TABLET | Freq: Every day | ORAL | 0 refills | Status: DC

## 2022-03-08 NOTE — Assessment & Plan Note (Signed)
Blood pressure is initially elevated but better on repeat.  She was on both labetalol and nifedipine.  The nifedipine was discontinued and she has been taking only labetalol.  For ease of administration, we will switch that back to nifedipine and stop the labetalol.  She was given an advance hypertension clinic booklet and asked to track her blood pressure twice daily.  Her blood pressure goal is less than 130/80.  We discussed the importance of increasing her exercise to at least 150 minutes weekly.  We also referred her to the right start program at [facility]  Discussed importance of sodium restriction.  Recommend limiting alcohol to no more than 1 drink daily, no more than 7 in a week.

## 2022-03-08 NOTE — Patient Instructions (Signed)
Medication Instructions:  STOP LABETALOL  START NIFEDIPINE 30 MG DAILY     Labwork: NONE   Testing/Procedures: NONE   Follow-Up: 04/06/2022 3:00 PM WITH PHARM D    Referrals:  [facility]    Special Instructions:  MONITOR YOUR BLOOD PRESSURE TWICE A DAY, LOG IN THE BOOK PROVIDED. BRING THE BOOK AND YOUR BLOOD PRESSURE MACHINE TO YOUR FOLLOW UP IN 1 MONTH    DASH Eating Plan DASH stands for "Dietary Approaches to Stop Hypertension." The DASH eating plan is a healthy eating plan that has been shown to reduce high blood pressure (hypertension). It may also reduce your risk for type 2 diabetes, heart disease, and stroke. The DASH eating plan may also help with weight loss. What are tips for following this plan?  General guidelines Avoid eating more than 2,300 mg (milligrams) of salt (sodium) a day. If you have hypertension, you may need to reduce your sodium intake to 1,500 mg a day. Limit alcohol intake to no more than 1 drink a day for nonpregnant women and 2 drinks a day for men. One drink equals 12 oz of beer, 5 oz of wine, or 1 oz of hard liquor. Work with your health care provider to maintain a healthy body weight or to lose weight. Ask what an ideal weight is for you. Get at least 30 minutes of exercise that causes your heart to beat faster (aerobic exercise) most days of the week. Activities may include walking, swimming, or biking. Work with your health care provider or diet and nutrition specialist (dietitian) to adjust your eating plan to your individual calorie needs. Reading food labels  Check food labels for the amount of sodium per serving. Choose foods with less than 5 percent of the Daily Value of sodium. Generally, foods with less than 300 mg of sodium per serving fit into this eating plan. To find whole grains, look for the word "whole" as the first word in the ingredient list. Shopping Buy products labeled as "low-sodium" or "no salt added." Buy fresh  foods. Avoid canned foods and premade or frozen meals. Cooking Avoid adding salt when cooking. Use salt-free seasonings or herbs instead of table salt or sea salt. Check with your health care provider or pharmacist before using salt substitutes. Do not fry foods. Cook foods using healthy methods such as baking, boiling, grilling, and broiling instead. Cook with heart-healthy oils, such as olive, canola, soybean, or sunflower oil. Meal planning Eat a balanced diet that includes: 5 or more servings of fruits and vegetables each day. At each meal, try to fill half of your plate with fruits and vegetables. Up to 6-8 servings of whole grains each day. Less than 6 oz of lean meat, poultry, or fish each day. A 3-oz serving of meat is about the same size as a deck of cards. One egg equals 1 oz. 2 servings of low-fat dairy each day. A serving of nuts, seeds, or beans 5 times each week. Heart-healthy fats. Healthy fats called Omega-3 fatty acids are found in foods such as flaxseeds and coldwater fish, like sardines, salmon, and mackerel. Limit how much you eat of the following: Canned or prepackaged foods. Food that is high in trans fat, such as fried foods. Food that is high in saturated fat, such as fatty meat. Sweets, desserts, sugary drinks, and other foods with added sugar. Full-fat dairy products. Do not salt foods before eating. Try to eat at least 2 vegetarian meals each week. Eat more home-cooked  food and less restaurant, buffet, and fast food. When eating at a restaurant, ask that your food be prepared with less salt or no salt, if possible. What foods are recommended? The items listed may not be a complete list. Talk with your dietitian about what dietary choices are best for you. Grains Whole-grain or whole-wheat bread. Whole-grain or whole-wheat pasta. Brown rice. [place]. Bulgur. Whole-grain and low-sodium cereals. Pita bread. Low-fat, low-sodium crackers. Whole-wheat flour  tortillas. Vegetables Fresh or frozen vegetables (raw, steamed, roasted, or grilled). Low-sodium or reduced-sodium tomato and vegetable juice. Low-sodium or reduced-sodium tomato sauce and tomato paste. Low-sodium or reduced-sodium canned vegetables. Fruits All fresh, dried, or frozen fruit. Canned fruit in natural juice (without added sugar). Meat and other protein foods Skinless chicken or [place]. Ground chicken or [place]. Pork with fat trimmed off. Fish and seafood. Egg whites. Dried beans, peas, or lentils. Unsalted nuts, nut butters, and seeds. Unsalted canned beans. Lean cuts of beef with fat trimmed off. Low-sodium, lean deli meat. Dairy Low-fat (1%) or fat-free (skim) milk. Fat-free, low-fat, or reduced-fat cheeses. Nonfat, low-sodium ricotta or cottage cheese. Low-fat or nonfat yogurt. Low-fat, low-sodium cheese. Fats and oils Soft margarine without trans fats. Vegetable oil. Low-fat, reduced-fat, or light mayonnaise and salad dressings (reduced-sodium). Canola, safflower, olive, soybean, and sunflower oils. Avocado. Seasoning and other foods Herbs. Spices. Seasoning mixes without salt. Unsalted popcorn and pretzels. Fat-free sweets. What foods are not recommended? The items listed may not be a complete list. Talk with your dietitian about what dietary choices are best for you. Grains Baked goods made with fat, such as croissants, muffins, or some breads. Dry pasta or rice meal packs. Vegetables Creamed or fried vegetables. Vegetables in a cheese sauce. Regular canned vegetables (not low-sodium or reduced-sodium). Regular canned tomato sauce and paste (not low-sodium or reduced-sodium). Regular tomato and vegetable juice (not low-sodium or reduced-sodium). Angie Fava. Olives. Fruits Canned fruit in a light or heavy syrup. Fried fruit. Fruit in cream or butter sauce. Meat and other protein foods Fatty cuts of meat. Ribs. Fried meat. Berniece Salines. Sausage. Bologna and other processed lunch meats.  Salami. Fatback. Hotdogs. Bratwurst. Salted nuts and seeds. Canned beans with added salt. Canned or smoked fish. Whole eggs or egg yolks. Chicken or [place] with skin. Dairy Whole or 2% milk, cream, and half-and-half. Whole or full-fat cream cheese. Whole-fat or sweetened yogurt. Full-fat cheese. Nondairy creamers. Whipped toppings. Processed cheese and cheese spreads. Fats and oils Butter. Stick margarine. Lard. Shortening. Ghee. Bacon fat. Tropical oils, such as coconut, palm kernel, or palm oil. Seasoning and other foods Salted popcorn and pretzels. Onion salt, garlic salt, seasoned salt, table salt, and sea salt. Worcestershire sauce. Tartar sauce. Barbecue sauce. Teriyaki sauce. Soy sauce, including reduced-sodium. Steak sauce. Canned and packaged gravies. Fish sauce. Oyster sauce. Cocktail sauce. Horseradish that you find on the shelf. Ketchup. Mustard. Meat flavorings and tenderizers. Bouillon cubes. Hot sauce and Tabasco sauce. Premade or packaged marinades. Premade or packaged taco seasonings. Relishes. Regular salad dressings. Where to find more information: National Heart, Lung, and [place]: https://wilson-eaton.com/ American Heart Association: www.heart.org Summary The DASH eating plan is a healthy eating plan that has been shown to reduce high blood pressure (hypertension). It may also reduce your risk for type 2 diabetes, heart disease, and stroke. With the DASH eating plan, you should limit salt (sodium) intake to 2,300 mg a day. If you have hypertension, you may need to reduce your sodium intake to 1,500 mg a day. When on the  DASH eating plan, aim to eat more fresh fruits and vegetables, whole grains, lean proteins, low-fat dairy, and heart-healthy fats. Work with your health care provider or diet and nutrition specialist (dietitian) to adjust your eating plan to your individual calorie needs. This information is not intended to replace advice given to you by your health care provider.  Make sure you discuss any questions you have with your health care provider. Document Released: 02/04/2011 Document Revised: 01/28/2017 Document Reviewed: 02/09/2016 Elsevier Patient Education  2020 [employer].

## 2022-03-08 NOTE — Progress Notes (Signed)
Cardiology Office Note:    Date:  03/12/2022   ID:  Kaitlyn Choi, DOB Jun 29, 1980, MRN [id number]  PCP:  Jerrol Banana, MD   [facility] Providers Cardiologist:  None     Referring MD: Maxie Better, MD   CC: Hypertension  History of Present Illness:    Kaitlyn Choi is a 42 y.o. female with a hx of hypertension, asthma, and obesity here to establish care and advance hypertension clinic.  She was seen by Jerrol Banana, MD on 07/20/2021 and lisinopril was discontinued as her BP had not responded well to it. She was started on labetalol twice daily.  Blood pressure remained above goal and nifedipine was added.  It was noted that she does have desire for future pregnancy.  She was referred to advanced hypertension clinic for further management.    Today, she is well overall. She has had HTN for a year. She does not check her BP at home. Her BP in clinic today was 132/89. Upon recheck it improved to 112/82. She is tolerating her labetalol. She takes Advil and Tylenol for pain. She does not exercise regularly and works from home. She has a sedentary lifestyle. When engaging in strenuous activities she gets out of breath more easily compared to the last 6 months. She eats out and cooks at home. She doesn't watch her salt intake. She drinks 1-2 cups of coffee with coffee and expresso. She regularly consumes alcohol, sometimes binging on 4-5 drinks,  3-4 days a week. She denies any withdrawals symptoms from alcohol. She is not a smoker. She denies any palpitations or chest pain. No lightheadedness, headaches, syncope, orthopnea, or PND.    Past Medical History:  Diagnosis Date   Anxiety    Asthma    Depression    Hypertension    Insomnia     Past Surgical History:  Procedure Laterality Date   ANKLE FRACTURE SURGERY Right 12/2015    Current Medications: Current Meds  Medication Sig   albuterol (ACCUNEB) 1.25 MG/3ML nebulizer solution Take 3 mLs (1.25 mg total) by  nebulization every 6 (six) hours as needed for wheezing.   albuterol (VENTOLIN HFA) 108 (90 Base) MCG/ACT inhaler Inhale 2 puffs into the lungs every 6 (six) hours as needed for wheezing or shortness of breath (Cough).   citalopram (CELEXA) 10 MG tablet TAKE 1 TABLET BY MOUTH EVERY DAY   clobetasol (TEMOVATE) 0.05 % external solution Apply 1 application topically 2 (two) times daily.   diphenhydrAMINE (BENADRYL) 25 MG tablet Take 50 mg by mouth at bedtime as needed.   doxylamine, Sleep, (UNISOM) 25 MG tablet Take 25 mg by mouth at bedtime as needed for sleep.   MELATONIN PO Take 12 mg by mouth at bedtime.   polyethylene glycol (MIRALAX / GLYCOLAX) 17 g packet Take 17 g by mouth daily. Adjust every 3 days until stooling regularly   sennosides-docusate sodium (SENOKOT-S) 8.6-50 MG tablet Take 1 tablet by mouth daily as needed for constipation.   [DISCONTINUED] labetalol (NORMODYNE) 100 MG tablet TAKE 1 TABLET BY MOUTH TWICE A DAY   [DISCONTINUED] NIFEdipine (PROCARDIA-XL/NIFEDICAL-XL) 30 MG 24 hr tablet TAKE 1 TABLET BY MOUTH EVERY DAY     Allergies:   Latex   Social History   Socioeconomic History   Marital status: Single    Spouse name: Not on file   Number of children: 0   Years of education: 16   Highest education level: Bachelor's degree (e.g., BA, AB, BS)  Occupational  History   Occupation: [employer], environmental  Tobacco Use   Smoking status: Never   Smokeless tobacco: Never  Vaping Use   Vaping Use: Never used  Substance and Sexual Activity   Alcohol use: Not Currently    Alcohol/week: 5.0 standard drinks of alcohol    Types: 5 Standard drinks or equivalent per week   Drug use: Never   Sexual activity: Yes    Partners: Male    Birth control/protection: None  Other Topics Concern   Not on file  Social History Narrative   Not on file   Social Determinants of Health   Financial Resource Strain: Not on file  Food Insecurity: Not on file  Transportation Needs:  Not on file  Physical Activity: Not on file  Stress: Not on file  Social Connections: Not on file     Family History: The patient's family history includes Breast cancer in her paternal grandmother; Heart disease in her maternal grandmother; Hypertension in her father, maternal grandmother, and paternal grandmother; Kidney failure in her paternal grandmother; Lung cancer in her maternal grandmother; Stroke in her father, maternal uncle, and paternal grandfather; Sudden Cardiac Death in her maternal grandfather.  ROS:   Please see the history of present illness.    (+) bilateral LE swelling (+) exertional SOB  All other systems reviewed and are negative.  EKGs/Labs/Other Studies Reviewed:    The following studies were reviewed today: No prior cardiovascular studies available.   EKG:  EKG is personally reviewed. 03/08/2022: Sinus rhythm LAFB and LVH. Rate 91 bpm.  Recent Labs: 04/16/2021: ALT 27; BUN 9; Creatinine, Ser 0.97; Hemoglobin 13.3; Platelets 346; Potassium 4.2; Sodium 137; TSH 1.600   Recent Lipid Panel    Component Value Date/Time   CHOL 144 04/16/2021 1035   TRIG 118 04/16/2021 1035   HDL 81 04/16/2021 1035   CHOLHDL 1.8 04/16/2021 1035   LDLCALC 43 04/16/2021 1035     Risk Assessment/Calculations:      Physical Exam:    VS:  BP 112/82 (BP Location: Left Arm, Patient Position: Sitting, Cuff Size: Large)   Pulse 91   Ht 5\' 7"  (1.702 m)   Wt 254 lb 4.8 oz (115.3 kg)   BMI 39.83 kg/m     Wt Readings from Last 3 Encounters:  03/08/22 254 lb 4.8 oz (115.3 kg)  09/04/21 234 lb (106.1 kg)  09/04/21 234 lb (106.1 kg)     VS:  BP 112/82 (BP Location: Left Arm, Patient Position: Sitting, Cuff Size: Large)   Pulse 91   Ht 5\' 7"  (1.702 m)   Wt 254 lb 4.8 oz (115.3 kg)   BMI 39.83 kg/m  , BMI Body mass index is 39.83 kg/m. GENERAL:  Well appearing HEENT: Pupils equal round and reactive, fundi not visualized, oral mucosa unremarkable NECK:  No jugular venous  distention, waveform within normal limits, carotid upstroke brisk and symmetric, no bruits, no thyromegaly LYMPHATICS:  No cervical adenopathy LUNGS:  Clear to auscultation bilaterally HEART:  RRR.  PMI not displaced or sustained,S1 and S2 within normal limits, no S3, no S4, no clicks, no rubs, no murmurs ABD:  Flat, positive bowel sounds normal in frequency in pitch, no bruits, no rebound, no guarding, no midline pulsatile mass, no hepatomegaly, no splenomegaly EXT:  2 plus pulses throughout, no edema, no cyanosis no clubbing SKIN:  No rashes no nodules NEURO:  Cranial nerves II through XII grossly intact, motor grossly intact throughout PSYCH:  Cognitively intact, oriented to person  place and time   ASSESSMENT:    1. Primary hypertension    PLAN:    Primary hypertension Blood pressure is initially elevated but better on repeat.  She was on both labetalol and nifedipine.  The nifedipine was discontinued and she has been taking only labetalol.  For ease of administration, we will switch that back to nifedipine and stop the labetalol.  She was given an advance hypertension clinic booklet and asked to track her blood pressure twice daily.  Her blood pressure goal is less than 130/80.  We discussed the importance of increasing her exercise to at least 150 minutes weekly.  We also referred her to the right start program at [facility], [place].  Discussed importance of sodium restriction.  Recommend limiting alcohol to no more than 1 drink daily, no more than 7 in a week.   Disposition: FU with Gillian Shields, NP or Kristen, NPH in 1 month.  Medication Adjustments/Labs and Tests Ordered: Current medicines are reviewed at length with the patient today.  Concerns regarding medicines are outlined above.  Orders Placed This Encounter  Procedures   EKG 12-Lead   Meds ordered this encounter  Medications   NIFEdipine (PROCARDIA-XL/NIFEDICAL-XL) 30 MG 24 hr tablet    Sig: Take 1 tablet (30 mg total) by  mouth daily.    Dispense:  90 tablet    Refill:  0   Patient Instructions  Medication Instructions:  STOP LABETALOL  START NIFEDIPINE 30 MG DAILY     Labwork: NONE   Testing/Procedures: NONE   Follow-Up: 04/06/2022 3:00 PM WITH PHARM D    Referrals:  [facility]    Special Instructions:  MONITOR YOUR BLOOD PRESSURE TWICE A DAY, LOG IN THE BOOK PROVIDED. BRING THE BOOK AND YOUR BLOOD PRESSURE MACHINE TO YOUR FOLLOW UP IN 1 MONTH    DASH Eating Plan DASH stands for "Dietary Approaches to Stop Hypertension." The DASH eating plan is a healthy eating plan that has been shown to reduce high blood pressure (hypertension). It may also reduce your risk for type 2 diabetes, heart disease, and stroke. The DASH eating plan may also help with weight loss. What are tips for following this plan?  General guidelines Avoid eating more than 2,300 mg (milligrams) of salt (sodium) a day. If you have hypertension, you may need to reduce your sodium intake to 1,500 mg a day. Limit alcohol intake to no more than 1 drink a day for nonpregnant women and 2 drinks a day for men. One drink equals 12 oz of beer, 5 oz of wine, or 1 oz of hard liquor. Work with your health care provider to maintain a healthy body weight or to lose weight. Ask what an ideal weight is for you. Get at least 30 minutes of exercise that causes your heart to beat faster (aerobic exercise) most days of the week. Activities may include walking, swimming, or biking. Work with your health care provider or diet and nutrition specialist (dietitian) to adjust your eating plan to your individual calorie needs. Reading food labels  Check food labels for the amount of sodium per serving. Choose foods with less than 5 percent of the Daily Value of sodium. Generally, foods with less than 300 mg of sodium per serving fit into this eating plan. To find whole grains, look for the word "whole" as the first word in the ingredient  list. Shopping Buy products labeled as "low-sodium" or "no salt added." Buy fresh foods. Avoid canned foods and premade or  frozen meals. Cooking Avoid adding salt when cooking. Use salt-free seasonings or herbs instead of table salt or sea salt. Check with your health care provider or pharmacist before using salt substitutes. Do not fry foods. Cook foods using healthy methods such as baking, boiling, grilling, and broiling instead. Cook with heart-healthy oils, such as olive, canola, soybean, or sunflower oil. Meal planning Eat a balanced diet that includes: 5 or more servings of fruits and vegetables each day. At each meal, try to fill half of your plate with fruits and vegetables. Up to 6-8 servings of whole grains each day. Less than 6 oz of lean meat, poultry, or fish each day. A 3-oz serving of meat is about the same size as a deck of cards. One egg equals 1 oz. 2 servings of low-fat dairy each day. A serving of nuts, seeds, or beans 5 times each week. Heart-healthy fats. Healthy fats called Omega-3 fatty acids are found in foods such as flaxseeds and coldwater fish, like sardines, salmon, and mackerel. Limit how much you eat of the following: Canned or prepackaged foods. Food that is high in trans fat, such as fried foods. Food that is high in saturated fat, such as fatty meat. Sweets, desserts, sugary drinks, and other foods with added sugar. Full-fat dairy products. Do not salt foods before eating. Try to eat at least 2 vegetarian meals each week. Eat more home-cooked food and less restaurant, buffet, and fast food. When eating at a restaurant, ask that your food be prepared with less salt or no salt, if possible. What foods are recommended? The items listed may not be a complete list. Talk with your dietitian about what dietary choices are best for you. Grains Whole-grain or whole-wheat bread. Whole-grain or whole-wheat pasta. Brown rice. [place]. Bulgur. Whole-grain and  low-sodium cereals. Pita bread. Low-fat, low-sodium crackers. Whole-wheat flour tortillas. Vegetables Fresh or frozen vegetables (raw, steamed, roasted, or grilled). Low-sodium or reduced-sodium tomato and vegetable juice. Low-sodium or reduced-sodium tomato sauce and tomato paste. Low-sodium or reduced-sodium canned vegetables. Fruits All fresh, dried, or frozen fruit. Canned fruit in natural juice (without added sugar). Meat and other protein foods Skinless chicken or [place]. Ground chicken or [place]. Pork with fat trimmed off. Fish and seafood. Egg whites. Dried beans, peas, or lentils. Unsalted nuts, nut butters, and seeds. Unsalted canned beans. Lean cuts of beef with fat trimmed off. Low-sodium, lean deli meat. Dairy Low-fat (1%) or fat-free (skim) milk. Fat-free, low-fat, or reduced-fat cheeses. Nonfat, low-sodium ricotta or cottage cheese. Low-fat or nonfat yogurt. Low-fat, low-sodium cheese. Fats and oils Soft margarine without trans fats. Vegetable oil. Low-fat, reduced-fat, or light mayonnaise and salad dressings (reduced-sodium). Canola, safflower, olive, soybean, and sunflower oils. Avocado. Seasoning and other foods Herbs. Spices. Seasoning mixes without salt. Unsalted popcorn and pretzels. Fat-free sweets. What foods are not recommended? The items listed may not be a complete list. Talk with your dietitian about what dietary choices are best for you. Grains Baked goods made with fat, such as croissants, muffins, or some breads. Dry pasta or rice meal packs. Vegetables Creamed or fried vegetables. Vegetables in a cheese sauce. Regular canned vegetables (not low-sodium or reduced-sodium). Regular canned tomato sauce and paste (not low-sodium or reduced-sodium). Regular tomato and vegetable juice (not low-sodium or reduced-sodium). Angie Fava. Olives. Fruits Canned fruit in a light or heavy syrup. Fried fruit. Fruit in cream or butter sauce. Meat and other protein foods Fatty cuts of  meat. Ribs. Fried meat. Berniece Salines. Sausage. Bologna and other  processed lunch meats. Salami. Fatback. Hotdogs. Bratwurst. Salted nuts and seeds. Canned beans with added salt. Canned or smoked fish. Whole eggs or egg yolks. Chicken or [place] with skin. Dairy Whole or 2% milk, cream, and half-and-half. Whole or full-fat cream cheese. Whole-fat or sweetened yogurt. Full-fat cheese. Nondairy creamers. Whipped toppings. Processed cheese and cheese spreads. Fats and oils Butter. Stick margarine. Lard. Shortening. Ghee. Bacon fat. Tropical oils, such as coconut, palm kernel, or palm oil. Seasoning and other foods Salted popcorn and pretzels. Onion salt, garlic salt, seasoned salt, table salt, and sea salt. Worcestershire sauce. Tartar sauce. Barbecue sauce. Teriyaki sauce. Soy sauce, including reduced-sodium. Steak sauce. Canned and packaged gravies. Fish sauce. Oyster sauce. Cocktail sauce. Horseradish that you find on the shelf. Ketchup. Mustard. Meat flavorings and tenderizers. Bouillon cubes. Hot sauce and Tabasco sauce. Premade or packaged marinades. Premade or packaged taco seasonings. Relishes. Regular salad dressings. Where to find more information: National Heart, Lung, and Blood Institute: PopSteam.is American Heart Association: www.heart.org Summary The DASH eating plan is a healthy eating plan that has been shown to reduce high blood pressure (hypertension). It may also reduce your risk for type 2 diabetes, heart disease, and stroke. With the DASH eating plan, you should limit salt (sodium) intake to 2,300 mg a day. If you have hypertension, you may need to reduce your sodium intake to 1,500 mg a day. When on the DASH eating plan, aim to eat more fresh fruits and vegetables, whole grains, lean proteins, low-fat dairy, and heart-healthy fats. Work with your health care provider or diet and nutrition specialist (dietitian) to adjust your eating plan to your individual calorie needs. This  information is not intended to replace advice given to you by your health care provider. Make sure you discuss any questions you have with your health care provider. Document Released: 02/04/2011 Document Revised: 01/28/2017 Document Reviewed: 02/09/2016 Elsevier Patient Education  2020 [employer].    I,Mitra Faeizi,acting as a Neurosurgeon for Chilton Si, MD.,have documented all relevant documentation on the behalf of Chilton Si, MD,as directed by  Chilton Si, MD while in the presence of Chilton Si, MD.  I, Lorely Bubb C. Duke Salvia, MD have reviewed all documentation for this visit.  The documentation of the exam, diagnosis, procedures, and orders on 03/12/2022 are all accurate and complete.    Signed, Chilton Si, MD  03/12/2022 11:25 AM    [facility]

## 2022-03-11 ENCOUNTER — Encounter (HOSPITAL_BASED_OUTPATIENT_CLINIC_OR_DEPARTMENT_OTHER): Payer: Self-pay | Admitting: Cardiovascular Disease

## 2022-03-15 DIAGNOSIS — J301 Allergic rhinitis due to pollen: Secondary | ICD-10-CM | POA: Diagnosis not present

## 2022-03-18 ENCOUNTER — Ambulatory Visit
Admission: RE | Admit: 2022-03-18 | Discharge: 2022-03-18 | Disposition: A | Discharge status: Home / Self Care | Payer: BC Managed Care – PPO | Source: Ambulatory Visit | Attending: Nurse Practitioner | Admitting: Nurse Practitioner

## 2022-03-18 VITALS — BP 114/86 | HR 85 | Temp 98.2°F | Resp 18

## 2022-03-18 DIAGNOSIS — R0981 Nasal congestion: Secondary | ICD-10-CM

## 2022-03-18 DIAGNOSIS — Z1152 Encounter for screening for COVID-19: Secondary | ICD-10-CM | POA: Insufficient documentation

## 2022-03-18 DIAGNOSIS — R0982 Postnasal drip: Secondary | ICD-10-CM | POA: Diagnosis not present

## 2022-03-18 NOTE — Discharge Instructions (Signed)
Start over-the-counter allergy medication such as Claritin or Zyrtec daily for at least 1 week Start Flonase daily The clinic will contact you with results of your testing are positive Rest and fluids Follow-up with your PCP 2 to 3 days for recheck Please go to emergency room if you have any worsening symptoms

## 2022-03-18 NOTE — ED Provider Notes (Signed)
[facility]    CSN: [id number] Arrival date & time: 03/18/22  1139      History   Chief Complaint Chief Complaint  Patient presents with   Nasal Congestion    Waking up very congested with phlegm in throat. This morning it was hard with blood in it. - Entered by patient    HPI Kaitlyn Choi is a 42 y.o. female who presents for evaluation of URI symptoms intermittently for 1 month.  Patient reports associated symptoms of congestion, postnasal drip, scratchy throat.  This morning she states there is some blood in her nasal discharge after blowing her nose, causing her concern.  Denies N/V/D, cough, fevers, chills, ear pain, body aches, shortness of breath. Patient does have a hx of asthma, she does have an inhaler and has not had to use since symptom onset. no smoking. No known sick contacts and no recent travel. Pt is vaccinated for COVID. Pt is not vaccinated for flu this season. Pt has taken nothing OTC for symptoms. Pt has no other concerns at this time.   HPI  Past Medical History:  Diagnosis Date   Anxiety    Asthma    Depression    Hypertension    Insomnia     Patient Active Problem List   Diagnosis Date Noted   Constipation 09/04/2021   Allergic conjunctivitis, left eye 09/04/2021   Primary hypertension 05/20/2021   Right upper quadrant pain 04/16/2021   Annual physical exam 04/16/2021   COVID 03/12/2021   Class 2 severe obesity due to excess calories with serious comorbidity and body mass index (BMI) of 36.0 to 36.9 in adult [facility]) 03/12/2021   Anxiety and depression 12/17/2020   Flank pain 12/17/2020   Abnormal urinalysis 12/17/2020   Chronic right-sided low back pain without sciatica 12/17/2020   Asthma action plan declined 12/17/1983    Past Surgical History:  Procedure Laterality Date   ANKLE FRACTURE SURGERY Right 12/2015    OB History     Gravida  0   Para  0   Term  0   Preterm  0   AB  0   Living  0      SAB  0   IAB  0    Ectopic  0   Multiple  0   Live Births  0            Home Medications    Prior to Admission medications   Medication Sig Start Date End Date Taking? Authorizing Provider  albuterol (ACCUNEB) 1.25 MG/3ML nebulizer solution Take 3 mLs (1.25 mg total) by nebulization every 6 (six) hours as needed for wheezing. 11/21/21 03/08/22  Lynden Oxford Scales, PA-C  albuterol (VENTOLIN HFA) 108 (90 Base) MCG/ACT inhaler Inhale 2 puffs into the lungs every 6 (six) hours as needed for wheezing or shortness of breath (Cough). 11/21/21   Lynden Oxford Scales, PA-C  citalopram (CELEXA) 10 MG tablet TAKE 1 TABLET BY MOUTH EVERY DAY 11/19/21   Montel Culver, MD  clobetasol (TEMOVATE) 0.05 % external solution Apply 1 application topically 2 (two) times daily. 12/09/20   [provider]  diphenhydrAMINE (BENADRYL) 25 MG tablet Take 50 mg by mouth at bedtime as needed.    [provider]  doxylamine, Sleep, (UNISOM) 25 MG tablet Take 25 mg by mouth at bedtime as needed for sleep.    [provider]  MELATONIN PO Take 12 mg by mouth at bedtime.    [provider]  NIFEdipine (PROCARDIA-XL/NIFEDICAL-XL) 30 MG 24 hr tablet Take 1 tablet (30 mg total) by mouth daily. 03/08/22   Skeet Latch, MD  polyethylene glycol (MIRALAX / GLYCOLAX) 17 g packet Take 17 g by mouth daily. Adjust every 3 days until stooling regularly 09/04/21   Montel Culver, MD  sennosides-docusate sodium (SENOKOT-S) 8.6-50 MG tablet Take 1 tablet by mouth daily as needed for constipation. 09/04/21   Montel Culver, MD    Family History Family History  Problem Relation Age of Onset   Hypertension Father    Stroke Father    Stroke Maternal Uncle    Heart disease Maternal Grandmother    Hypertension Maternal Grandmother    Lung cancer Maternal Grandmother    Sudden Cardiac Death Maternal Grandfather    Hypertension Paternal Grandmother    Kidney failure Paternal Grandmother    Breast cancer  Paternal Grandmother    Stroke Paternal Grandfather     Social History Social History   Tobacco Use   Smoking status: Never   Smokeless tobacco: Never  Vaping Use   Vaping Use: Never used  Substance Use Topics   Alcohol use: Not Currently    Alcohol/week: 5.0 standard drinks of alcohol    Types: 5 Standard drinks or equivalent per week   Drug use: Never     Allergies   Latex   Review of Systems Review of Systems  HENT:  Positive for congestion and sore throat.      Physical Exam Triage Vital Signs ED Triage Vitals  Enc Vitals Group     BP 03/18/22 1153 114/86     Pulse Rate 03/18/22 1153 85     Resp 03/18/22 1153 18     Temp 03/18/22 1153 98.2 F (36.8 C)     Temp Source 03/18/22 1153 Oral     SpO2 03/18/22 1153 96 %     Weight --      Height --      Head Circumference --      Peak Flow --      Pain Score 03/18/22 1152 0     Pain Loc --      Pain Edu? --      Excl. in [place]? --    No data found.  Updated Vital Signs BP 114/86 (BP Location: Right Arm)   Pulse 85   Temp 98.2 F (36.8 C) (Oral)   Resp 18   LMP 02/26/2022   SpO2 96%   Visual Acuity Right Eye Distance:   Left Eye Distance:   Bilateral Distance:    Right Eye Near:   Left Eye Near:    Bilateral Near:     Physical Exam Vitals and nursing note reviewed.  Constitutional:      General: She is not in acute distress.    Appearance: She is well-developed. She is not ill-appearing.  HENT:     Head: Normocephalic and atraumatic.     Right Ear: Tympanic membrane and ear canal normal.     Left Ear: Tympanic membrane and ear canal normal.     Nose: Congestion present.     Mouth/Throat:     Mouth: Mucous membranes are moist.     Pharynx: Oropharynx is clear. Uvula midline. No pharyngeal swelling, oropharyngeal exudate, posterior oropharyngeal erythema or uvula swelling.     Tonsils: No tonsillar exudate or tonsillar abscesses.     Comments: Post nasal drainage noted  Eyes:      Conjunctiva/sclera: Conjunctivae normal.  Pupils: Pupils are equal, round, and reactive to light.  Cardiovascular:     Rate and Rhythm: Normal rate and regular rhythm.     Heart sounds: Normal heart sounds.  Pulmonary:     Effort: Pulmonary effort is normal.     Breath sounds: Normal breath sounds.  Musculoskeletal:     Cervical back: Normal range of motion and neck supple.  Lymphadenopathy:     Cervical: No cervical adenopathy.  Skin:    General: Skin is warm and dry.  Neurological:     General: No focal deficit present.     Mental Status: She is alert and oriented to person, place, and time.  Psychiatric:        Mood and Affect: Mood normal.        Behavior: Behavior normal.      UC Treatments / Results  Labs (all labs ordered are listed, but only abnormal results are displayed) Labs Reviewed  SARS CORONAVIRUS 2 (TAT 6-24 HRS)    EKG   Radiology No results found.  Procedures Procedures (including critical care time)  Medications Ordered in UC Medications - No data to display  Initial Impression / Assessment and Plan / UC Course  I have reviewed the triage vital signs and the nursing notes.  Pertinent labs & imaging results that were available during my care of the patient were reviewed by me and considered in my medical decision making (see chart for details).     Reviewed exam and symptoms with patient.  No red flags on exam. COVID PCR and will contact if positive Discussed likely allergy component given length of symptoms Advised starting OTC allergy medication such as Claritin or Zyrtec daily for at least 1 week Flonase daily Follow-up with PCP 2 to 3 days for recheck ER precautions reviewed and patient verbalized understanding Final Clinical Impressions(s) / UC Diagnoses   Final diagnoses:  Nasal congestion  Post-nasal drip     Discharge Instructions      Start over-the-counter allergy medication such as Claritin or Zyrtec daily for at  least 1 week Start Flonase daily The clinic will contact you with results of your testing are positive Rest and fluids Follow-up with your PCP 2 to 3 days for recheck Please go to emergency room if you have any worsening symptoms   ED Prescriptions   None    PDMP not reviewed this encounter.   Radford Pax, NP 03/18/22 [phone]

## 2022-03-18 NOTE — ED Triage Notes (Signed)
Pt reports having congestion.   Started: this morning   Home interventions: none

## 2022-03-19 LAB — SARS CORONAVIRUS 2 (TAT 6-24 HRS): SARS Coronavirus 2: NEGATIVE

## 2022-03-29 ENCOUNTER — Ambulatory Visit: Payer: BC Managed Care – PPO | Admitting: Medical

## 2022-03-30 DIAGNOSIS — Z319 Encounter for procreative management, unspecified: Secondary | ICD-10-CM | POA: Diagnosis not present

## 2022-03-30 DIAGNOSIS — Z1231 Encounter for screening mammogram for malignant neoplasm of breast: Secondary | ICD-10-CM | POA: Diagnosis not present

## 2022-03-30 DIAGNOSIS — N923 Ovulation bleeding: Secondary | ICD-10-CM | POA: Diagnosis not present

## 2022-04-06 ENCOUNTER — Ambulatory Visit (INDEPENDENT_AMBULATORY_CARE_PROVIDER_SITE_OTHER): Payer: BC Managed Care – PPO | Admitting: Pharmacist Clinician (PhC)/ Clinical Pharmacy Specialist

## 2022-04-06 VITALS — BP 137/92 | Ht 67.0 in | Wt 246.0 lb

## 2022-04-06 DIAGNOSIS — I10 Essential (primary) hypertension: Secondary | ICD-10-CM | POA: Diagnosis not present

## 2022-04-06 MED ORDER — NIFEDIPINE ER OSMOTIC RELEASE 60 MG PO TB24: 60.0000 mg | ORAL_TABLET | Freq: Every day | ORAL | 3 refills | Status: DC

## 2022-04-06 NOTE — Patient Instructions (Signed)
Follow up appointment: Monday March 4 at 3 pm  Take your BP meds as follows:  Increase nifedipine to 60 mg once daily (take 2 of the 30 mg tabs daily until gone)  Check your blood pressure at home daily (if able) and keep record of the readings.  Hypertension "High blood pressure"  Hypertension is often called "The Silent Killer." It rarely causes symptoms until it is extremely  high or has done damage to other organs in the body. For this reason, you should have your  blood pressure checked regularly by your physician. We will check your blood pressure  every time you see a provider at one of our offices.   Your blood pressure reading consists of two numbers. Ideally, blood pressure should be  below 120/80. The first ("top") number is called the systolic pressure. It measures the  pressure in your arteries as your heart beats. The second ("bottom") number is called the diastolic pressure. It measures the pressure in your arteries as the heart relaxes between beats.  The benefits of getting your blood pressure under control are enormous. A 10-point  reduction in systolic blood pressure can reduce your risk of stroke by 27% and heart failure by 28%  Your blood pressure goal is < 130/80  To check your pressure at home you will need to:  1. Sit up in a chair, with feet flat on the floor and back supported. Do not cross your ankles or legs. 2. Rest your left arm so that the cuff is about heart level. If the cuff goes on your upper arm,  then just relax the arm on the table, arm of the chair or your lap. If you have a wrist cuff, we  suggest relaxing your wrist against your chest (think of it as Pledging the Flag with the  wrong arm).  3. Place the cuff snugly around your arm, about 1 inch above the crook of your elbow. The  cords should be inside the groove of your elbow.  4. Sit quietly, with the cuff in place, for about 5 minutes. After that 5 minutes press the power  button to  start a reading. 5. Do not talk or move while the reading is taking place.  6. Record your readings on a sheet of paper. Although most cuffs have a memory, it is often  easier to see a pattern developing when the numbers are all in front of you.  7. You can repeat the reading after 1-3 minutes if it is recommended  Make sure your bladder is empty and you have not had caffeine or tobacco within the last 30 min  Always bring your blood pressure log with you to your appointments. If you have not brought your monitor in to be double checked for accuracy, please bring it to your next appointment.  You can find a list of quality blood pressure cuffs at validatebp.org

## 2022-04-06 NOTE — Progress Notes (Signed)
Office Visit    Patient Name: Kaitlyn Choi Date of Encounter: 04/09/2022  Primary Care Provider:  Montel Culver, MD Primary Cardiologist:  None  Chief Complaint    Hypertension - Advanced hypertension clinic  Past Medical History   obesity 1/24 BMI 39.82  Anxiety/depression On citalopram    Allergies  Allergen Reactions   Latex Rash    History of Present Illness    Kaitlyn Choi is a 42 y.o. female patient who was referred to the [facility] by Dr. Servando Salina.  She was seen by Dr. Oval Linsey last month, at which time her BP was good at 112/82.  She was having trouble remembering to take labetalol twice daily, so it was discontinued and she remained on nifedipine xl 30 mg.     Today she is in the office for follow up.  She has been tracking home BP readings regularly, and has not had any issues with compliance now that she is just on nifedipine.  She is hoping to get pregnant in the near future, but is not today.   Blood Pressure Goal:  130/80  Current Medications: nifedipine xl 30 mg qd   Previously tried:   labetalol - no problems  Family Hx:  father died from stroke in his 29's; mom in good health; all grandparents with hypertension, no siblings  Social Hx:      Tobacco: no  Alcohol: occaisonally  - not recently  Caffeine:  not recently  Diet:  Daniel fast; eats at 6 pm; no meats or sugars; miso soup and fruits/vegetables, brown rice, beans (black, red, lentils)    Exercise: none  Home BP readings:  has two home cuffs, both [employer] brand (wrist and arm), both read within 10 points) AM: 18 readings average 129/85  (range 110-157/74-100) PM:  17 readings average 135/86  (range 118-161/73-97)   Accessory Clinical Findings    Lab Results  Component Value Date   CREATININE 0.97 04/16/2021   BUN 9 04/16/2021   NA 137 04/16/2021   K 4.2 04/16/2021   CL 99 04/16/2021   CO2 21 04/16/2021   Lab Results  Component Value Date   ALT 27  04/16/2021   AST 37 04/16/2021   ALKPHOS 61 04/16/2021   BILITOT 1.0 04/16/2021   Lab Results  Component Value Date   HGBA1C 5.6 04/16/2021    Screening for Secondary Hypertension:      03/08/2022    2:37 PM  Causes  Drugs/Herbals Screened     - Comments 1-2 coffees daily.  +EtOH. +Advil  Renovascular HTN N/A  Sleep Apnea Not Screened  Thyroid Disease Screened  Hyperaldosteronism N/A  Pheochromocytoma N/A  Cushing's Syndrome N/A  Hyperparathyroidism N/A  Coarctation of the Aorta Screened     - Comments BP symmmetric  Compliance Screened    Relevant Labs/Studies:    Latest Ref Rng & Units 04/16/2021   10:35 AM 12/09/2020   12:00 AM  Basic Labs  Sodium 134 - 144 mmol/L 137  140      Potassium 3.5 - 5.2 mmol/L 4.2  4.3      Creatinine 0.57 - 1.00 mg/dL 0.97  0.9         This result is from an external source.       Latest Ref Rng & Units 04/16/2021   10:35 AM 12/09/2020   12:00 AM  Thyroid   TSH 0.450 - 4.500 uIU/mL 1.600  1.81  This result is from an external source.                 Home Medications    Current Outpatient Medications  Medication Sig Dispense Refill   albuterol (ACCUNEB) 1.25 MG/3ML nebulizer solution Take 3 mLs (1.25 mg total) by nebulization every 6 (six) hours as needed for wheezing. 360 mL 0   albuterol (VENTOLIN HFA) 108 (90 Base) MCG/ACT inhaler Inhale 2 puffs into the lungs every 6 (six) hours as needed for wheezing or shortness of breath (Cough). 18 g 2   clobetasol (TEMOVATE) 0.05 % external solution Apply 1 application topically 2 (two) times daily.     diphenhydrAMINE (BENADRYL) 25 MG tablet Take 50 mg by mouth at bedtime as needed.     doxylamine, Sleep, (UNISOM) 25 MG tablet Take 25 mg by mouth at bedtime as needed for sleep.     MELATONIN PO Take 12 mg by mouth at bedtime.     NIFEdipine (PROCARDIA XL/NIFEDICAL XL) 60 MG 24 hr tablet Take 1 tablet (60 mg total) by mouth daily. 90 tablet 3   No current  facility-administered medications for this visit.     Assessment & Plan   BP 137/92,  on repeat 138/91  Primary hypertension Assessment: BP is uncontrolled in office BP 137/92 mmHg;  above the goal (<130/80). Not currently pregnant, not using any contraception at this time Tolerates nifedipine well without any side effects  Denies SOB, palpitation, chest pain, headaches,or swelling Reiterated the importance of regular exercise and low salt diet   Plan:  Increase nifedipine xl to 60 mg daily Patient to keep record of BP readings with heart rate and report to [place] at the next visit Patient to follow up with PharmD in 1 month for follow up  Labs ordered today:  none   Tommy Medal PharmD CPP [place]  [address] [place], [place] [address] [phone]

## 2022-04-09 ENCOUNTER — Encounter (HOSPITAL_BASED_OUTPATIENT_CLINIC_OR_DEPARTMENT_OTHER): Payer: Self-pay | Admitting: Pharmacist Clinician (PhC)/ Clinical Pharmacy Specialist

## 2022-04-09 NOTE — Assessment & Plan Note (Signed)
Assessment: BP is uncontrolled in office BP 137/92 mmHg;  above the goal (<130/80). Not currently pregnant, not using any contraception at this time Tolerates nifedipine well without any side effects  Denies SOB, palpitation, chest pain, headaches,or swelling Reiterated the importance of regular exercise and low salt diet   Plan:  Increase nifedipine xl to 60 mg daily Patient to keep record of BP readings with heart rate and report to [place] at the next visit Patient to follow up with PharmD in 1 month for follow up  Labs ordered today:  none

## 2022-04-15 ENCOUNTER — Encounter: Payer: Self-pay | Admitting: Family Medicine

## 2022-04-26 DIAGNOSIS — N912 Amenorrhea, unspecified: Secondary | ICD-10-CM | POA: Diagnosis not present

## 2022-04-28 DIAGNOSIS — O09299 Supervision of pregnancy with other poor reproductive or obstetric history, unspecified trimester: Secondary | ICD-10-CM | POA: Diagnosis not present

## 2022-05-03 ENCOUNTER — Ambulatory Visit (HOSPITAL_BASED_OUTPATIENT_CLINIC_OR_DEPARTMENT_OTHER): Payer: BC Managed Care – PPO

## 2022-05-03 NOTE — Progress Notes (Deleted)
Office Visit    Patient Name: Kaitlyn Choi Date of Encounter: 05/03/2022  Primary Care Provider:  Montel Culver, MD Primary Cardiologist:  None  Chief Complaint    Hypertension - Advanced hypertension clinic  Past Medical History   obesity 1/24 BMI 39.82  Anxiety/depression On citalopram    Allergies  Allergen Reactions   Latex Rash    History of Present Illness    Kaitlyn Choi is a 42 y.o. female patient who was referred to the [facility] by Dr. Servando Salina.  She was seen by Dr. Oval Linsey last month, at which time her BP was good at 112/82.  She was having trouble remembering to take labetalol twice daily, so it was discontinued and she remained on nifedipine xl 30 mg.   At a follow up last month the nifedipine was increased to 60 mg daily.  At the time she was not pregnant, but not using any contraception either.      Blood Pressure Goal:  130/80  Current Medications: nifedipine xl 30 mg qd   Previously tried:   labetalol - no problems  Family Hx:  father died from stroke in his 65's; mom in good health; all grandparents with hypertension, no siblings  Social Hx:      Tobacco: no  Alcohol: occaisonally  - not recently  Caffeine:  not recently  Diet:  Daniel fast; eats at 6 pm; no meats or sugars; miso soup and fruits/vegetables, brown rice, beans (black, red, lentils)    Exercise: none  Home BP readings:  has two home cuffs, both [employer] brand (wrist and arm), both read within 10 points) AM: 18 readings average 129/85  (range 110-157/74-100) PM:  17 readings average 135/86  (range 118-161/73-97)   Accessory Clinical Findings    Lab Results  Component Value Date   CREATININE 0.97 04/16/2021   BUN 9 04/16/2021   NA 137 04/16/2021   K 4.2 04/16/2021   CL 99 04/16/2021   CO2 21 04/16/2021   Lab Results  Component Value Date   ALT 27 04/16/2021   AST 37 04/16/2021   ALKPHOS 61 04/16/2021   BILITOT 1.0 04/16/2021   Lab  Results  Component Value Date   HGBA1C 5.6 04/16/2021    Screening for Secondary Hypertension:      03/08/2022    2:37 PM  Causes  Drugs/Herbals Screened     - Comments 1-2 coffees daily.  +EtOH. +Advil  Renovascular HTN N/A  Sleep Apnea Not Screened  Thyroid Disease Screened  Hyperaldosteronism N/A  Pheochromocytoma N/A  Cushing's Syndrome N/A  Hyperparathyroidism N/A  Coarctation of the Aorta Screened     - Comments BP symmmetric  Compliance Screened    Relevant Labs/Studies:    Latest Ref Rng & Units 04/16/2021   10:35 AM 12/09/2020   12:00 AM  Basic Labs  Sodium 134 - 144 mmol/L 137  140      Potassium 3.5 - 5.2 mmol/L 4.2  4.3      Creatinine 0.57 - 1.00 mg/dL 0.97  0.9         This result is from an external source.       Latest Ref Rng & Units 04/16/2021   10:35 AM 12/09/2020   12:00 AM  Thyroid   TSH 0.450 - 4.500 uIU/mL 1.600  1.81         This result is from an external source.  Home Medications    Current Outpatient Medications  Medication Sig Dispense Refill   albuterol (ACCUNEB) 1.25 MG/3ML nebulizer solution Take 3 mLs (1.25 mg total) by nebulization every 6 (six) hours as needed for wheezing. 360 mL 0   albuterol (VENTOLIN HFA) 108 (90 Base) MCG/ACT inhaler Inhale 2 puffs into the lungs every 6 (six) hours as needed for wheezing or shortness of breath (Cough). 18 g 2   clobetasol (TEMOVATE) 0.05 % external solution Apply 1 application topically 2 (two) times daily.     diphenhydrAMINE (BENADRYL) 25 MG tablet Take 50 mg by mouth at bedtime as needed.     doxylamine, Sleep, (UNISOM) 25 MG tablet Take 25 mg by mouth at bedtime as needed for sleep.     MELATONIN PO Take 12 mg by mouth at bedtime.     NIFEdipine (PROCARDIA XL/NIFEDICAL XL) 60 MG 24 hr tablet Take 1 tablet (60 mg total) by mouth daily. 90 tablet 3   No current facility-administered medications for this visit.     Assessment & Plan   BP 137/92,  on repeat  138/91  No problem-specific Assessment & Plan notes found for this encounter.   Tommy Medal PharmD CPP [facility]  [address] [place], [place] [address] [phone]

## 2022-05-04 DIAGNOSIS — Z3200 Encounter for pregnancy test, result unknown: Secondary | ICD-10-CM | POA: Diagnosis not present

## 2022-05-18 DIAGNOSIS — Z3491 Encounter for supervision of normal pregnancy, unspecified, first trimester: Secondary | ICD-10-CM | POA: Diagnosis not present

## 2022-06-07 DIAGNOSIS — O09529 Supervision of elderly multigravida, unspecified trimester: Secondary | ICD-10-CM | POA: Diagnosis not present

## 2022-06-07 DIAGNOSIS — O09521 Supervision of elderly multigravida, first trimester: Secondary | ICD-10-CM | POA: Diagnosis not present

## 2022-06-07 DIAGNOSIS — O09891 Supervision of other high risk pregnancies, first trimester: Secondary | ICD-10-CM | POA: Diagnosis not present

## 2022-06-07 LAB — OB RESULTS CONSOLE RUBELLA ANTIBODY, IGM: Rubella: IMMUNE

## 2022-06-07 LAB — OB RESULTS CONSOLE HEPATITIS B SURFACE ANTIGEN: Hepatitis B Surface Ag: NEGATIVE

## 2022-06-07 LAB — HEPATITIS C ANTIBODY: HCV Ab: NEGATIVE

## 2022-06-07 LAB — OB RESULTS CONSOLE RPR: RPR: NONREACTIVE

## 2022-06-07 LAB — OB RESULTS CONSOLE ANTIBODY SCREEN: Antibody Screen: NEGATIVE

## 2022-06-07 LAB — OB RESULTS CONSOLE HIV ANTIBODY (ROUTINE TESTING): HIV: NONREACTIVE

## 2022-06-08 ENCOUNTER — Ambulatory Visit (HOSPITAL_BASED_OUTPATIENT_CLINIC_OR_DEPARTMENT_OTHER): Payer: BC Managed Care – PPO

## 2022-06-08 NOTE — Progress Notes (Deleted)
Office Visit    Patient Name: Kaitlyn Choi Date of Encounter: 06/08/2022  Primary Care Provider:  Jerrol Banana, MD Primary Cardiologist:  None  Chief Complaint    Hypertension - Advanced hypertension clinic  Past Medical History   obesity 1/24 BMI 39.82  Anxiety/depression On citalopram    Allergies  Allergen Reactions   Latex Rash    History of Present Illness    Kaitlyn Choi is a 42 y.o. female patient who was referred to the [facility] by Dr. Maxie Better.  She was seen by Dr. Duke Salvia last month, at which time her BP was good at 112/82.  She was having trouble remembering to take labetalol twice daily, so it was discontinued and she remained on nifedipine xl 30 mg.   At follow up in February her BP was 137/92 and the nifedipine was increased to 60 mg daily.    Today she is in the office for follow up.   Blood Pressure Goal:  130/80  Current Medications: nifedipine xl 30 mg qd   Previously tried:   labetalol - no problems  Family Hx:  father died from stroke in his 25's; mom in good health; all grandparents with hypertension, no siblings  Social Hx:      Tobacco: no  Alcohol: occaisonally  - not recently  Caffeine:  not recently  Diet:  Daniel fast; eats at 6 pm; no meats or sugars; miso soup and fruits/vegetables, brown rice, beans (black, red, lentils)    Exercise: none  Home BP readings:  has two home cuffs, both [employer] brand (wrist and arm), both read within 10 points) AM: 18 readings average 129/85  (range 110-157/74-100) PM:  17 readings average 135/86  (range 118-161/73-97)   Accessory Clinical Findings    Lab Results  Component Value Date   CREATININE 0.97 04/16/2021   BUN 9 04/16/2021   NA 137 04/16/2021   K 4.2 04/16/2021   CL 99 04/16/2021   CO2 21 04/16/2021   Lab Results  Component Value Date   ALT 27 04/16/2021   AST 37 04/16/2021   ALKPHOS 61 04/16/2021   BILITOT 1.0 04/16/2021   Lab Results   Component Value Date   HGBA1C 5.6 04/16/2021    Screening for Secondary Hypertension:      03/08/2022    2:37 PM  Causes  Drugs/Herbals Screened     - Comments 1-2 coffees daily.  +EtOH. +Advil  Renovascular HTN N/A  Sleep Apnea Not Screened  Thyroid Disease Screened  Hyperaldosteronism N/A  Pheochromocytoma N/A  Cushing's Syndrome N/A  Hyperparathyroidism N/A  Coarctation of the Aorta Screened     - Comments BP symmmetric  Compliance Screened    Relevant Labs/Studies:    Latest Ref Rng & Units 04/16/2021   10:35 AM 12/09/2020   12:00 AM  Basic Labs  Sodium 134 - 144 mmol/L 137  140      Potassium 3.5 - 5.2 mmol/L 4.2  4.3      Creatinine 0.57 - 1.00 mg/dL 9.67  0.9         This result is from an external source.       Latest Ref Rng & Units 04/16/2021   10:35 AM 12/09/2020   12:00 AM  Thyroid   TSH 0.450 - 4.500 uIU/mL 1.600  1.81         This result is from an external source.  Home Medications    Current Outpatient Medications  Medication Sig Dispense Refill   albuterol (ACCUNEB) 1.25 MG/3ML nebulizer solution Take 3 mLs (1.25 mg total) by nebulization every 6 (six) hours as needed for wheezing. 360 mL 0   albuterol (VENTOLIN HFA) 108 (90 Base) MCG/ACT inhaler Inhale 2 puffs into the lungs every 6 (six) hours as needed for wheezing or shortness of breath (Cough). 18 g 2   clobetasol (TEMOVATE) 0.05 % external solution Apply 1 application topically 2 (two) times daily.     diphenhydrAMINE (BENADRYL) 25 MG tablet Take 50 mg by mouth at bedtime as needed.     doxylamine, Sleep, (UNISOM) 25 MG tablet Take 25 mg by mouth at bedtime as needed for sleep.     MELATONIN PO Take 12 mg by mouth at bedtime.     NIFEdipine (PROCARDIA XL/NIFEDICAL XL) 60 MG 24 hr tablet Take 1 tablet (60 mg total) by mouth daily. 90 tablet 3   No current facility-administered medications for this visit.     Assessment & Plan   BP 137/92,  on repeat 138/91  No  problem-specific Assessment & Plan notes found for this encounter.   Phillips Hay PharmD CPP [facility]  [address] [place], [place] [address] [phone]

## 2022-06-14 DIAGNOSIS — O161 Unspecified maternal hypertension, first trimester: Secondary | ICD-10-CM | POA: Diagnosis not present

## 2022-06-14 DIAGNOSIS — F32A Depression, unspecified: Secondary | ICD-10-CM | POA: Diagnosis not present

## 2022-06-14 DIAGNOSIS — O09891 Supervision of other high risk pregnancies, first trimester: Secondary | ICD-10-CM | POA: Diagnosis not present

## 2022-06-14 DIAGNOSIS — O09521 Supervision of elderly multigravida, first trimester: Secondary | ICD-10-CM | POA: Diagnosis not present

## 2022-07-12 DIAGNOSIS — O09529 Supervision of elderly multigravida, unspecified trimester: Secondary | ICD-10-CM | POA: Diagnosis not present

## 2022-07-12 DIAGNOSIS — O162 Unspecified maternal hypertension, second trimester: Secondary | ICD-10-CM | POA: Diagnosis not present

## 2022-07-12 DIAGNOSIS — O09522 Supervision of elderly multigravida, second trimester: Secondary | ICD-10-CM | POA: Diagnosis not present

## 2022-07-12 DIAGNOSIS — O09892 Supervision of other high risk pregnancies, second trimester: Secondary | ICD-10-CM | POA: Diagnosis not present

## 2022-07-19 ENCOUNTER — Other Ambulatory Visit: Payer: Self-pay | Admitting: Obstetrics and Gynecology

## 2022-07-19 DIAGNOSIS — O280 Abnormal hematological finding on antenatal screening of mother: Secondary | ICD-10-CM

## 2022-07-20 ENCOUNTER — Encounter: Payer: Self-pay | Admitting: *Deleted

## 2022-07-20 DIAGNOSIS — O09522 Supervision of elderly multigravida, second trimester: Secondary | ICD-10-CM | POA: Insufficient documentation

## 2022-07-21 ENCOUNTER — Ambulatory Visit (HOSPITAL_BASED_OUTPATIENT_CLINIC_OR_DEPARTMENT_OTHER): Payer: BC Managed Care – PPO | Admitting: Obstetrics

## 2022-07-21 ENCOUNTER — Ambulatory Visit: Payer: BC Managed Care – PPO | Attending: Obstetrics and Gynecology

## 2022-07-21 ENCOUNTER — Encounter: Payer: Self-pay | Admitting: *Deleted

## 2022-07-21 ENCOUNTER — Other Ambulatory Visit: Payer: Self-pay | Admitting: *Deleted

## 2022-07-21 ENCOUNTER — Ambulatory Visit: Payer: BC Managed Care – PPO | Admitting: *Deleted

## 2022-07-21 VITALS — BP 131/68 | HR 86

## 2022-07-21 DIAGNOSIS — O10912 Unspecified pre-existing hypertension complicating pregnancy, second trimester: Secondary | ICD-10-CM | POA: Diagnosis not present

## 2022-07-21 DIAGNOSIS — E669 Obesity, unspecified: Secondary | ICD-10-CM

## 2022-07-21 DIAGNOSIS — O10012 Pre-existing essential hypertension complicating pregnancy, second trimester: Secondary | ICD-10-CM | POA: Diagnosis not present

## 2022-07-21 DIAGNOSIS — O99212 Obesity complicating pregnancy, second trimester: Secondary | ICD-10-CM

## 2022-07-21 DIAGNOSIS — O09512 Supervision of elderly primigravida, second trimester: Secondary | ICD-10-CM

## 2022-07-21 DIAGNOSIS — O99512 Diseases of the respiratory system complicating pregnancy, second trimester: Secondary | ICD-10-CM

## 2022-07-21 DIAGNOSIS — Z3A16 16 weeks gestation of pregnancy: Secondary | ICD-10-CM

## 2022-07-21 DIAGNOSIS — O09522 Supervision of elderly multigravida, second trimester: Secondary | ICD-10-CM

## 2022-07-21 DIAGNOSIS — O280 Abnormal hematological finding on antenatal screening of mother: Secondary | ICD-10-CM | POA: Diagnosis not present

## 2022-07-21 DIAGNOSIS — J45909 Unspecified asthma, uncomplicated: Secondary | ICD-10-CM

## 2022-07-21 DIAGNOSIS — R772 Abnormality of alphafetoprotein: Secondary | ICD-10-CM

## 2022-07-21 NOTE — Progress Notes (Signed)
MFM Note  Kaitlyn Choi was seen due to an elevated MSAFP of 2.17 MoM and advanced maternal age (42 years old).  Her pregnancy has also been complicated by maternal obesity and chronic hypertension treated with nifedipine XL 60 mg daily.   Her cell free DNA test indicated a low risk for trisomy 21, 18, and 13.  A female fetus is predicted.  A viable singleton intrauterine gestation was noted on today's exam.  The fetal biometry measurements today are consistent with her [facility] of January 01, 2023.    The following were discussed during today's consultation:  Elevated MSAFP of 2.17 MoM  The patient was advised of the ultrasound findings that failed to reveal an anatomical cause for the increased MSAFP.    There were no sonographic signs of spina bifida or an anterior abdominal wall defect noted today.  Today's exam was limited due to her early gestational age.  She was reassured that the MSAFP was only minimally elevated as some labs would use a cutoff for a positive exam if the MSAFP was greater than or equal to 2.5 MoM.  She was advised regarding the limitations of ultrasound in the detection of all anomalies and that it will diagnose approximately 90% of neural tube defects.   The association of an elevated MSAFP with placental dysfunction which may manifest later in her pregnancy as fetal growth restriction was discussed.  Due to the elevated MSAFP, we will continue to follow her with growth ultrasounds throughout her pregnancy following her fetal anatomy scan.  Advanced maternal age (42 years old)  The increased risk of fetal aneuploidy due to advanced maternal age was discussed.   Due to advanced maternal age and the elevated MSAFP, the patient was offered and declined an amniocentesis today for definitive diagnosis of fetal aneuploidy and spina bifida.  She is comfortable with her negative cell free DNA test.  Chronic hypertension treated with Procardia  She should continue Procardia  for the duration of her pregnancy for blood pressure control.  She should also start a daily baby aspirin (81 mg) for preeclampsia prophylaxis.    Due to chronic hypertension treated with Procardia, we will start weekly fetal testing at 32 weeks.  She will return to our office at around 19 weeks for a detailed fetal anatomy scan.    The patient stated that all of her questions were answered today.  A total of 45 minutes was spent counseling and coordinating the care for this patient.  Greater than 50% of the time was spent in direct face-to-face contact.

## 2022-07-29 ENCOUNTER — Ambulatory Visit (INDEPENDENT_AMBULATORY_CARE_PROVIDER_SITE_OTHER): Payer: BC Managed Care – PPO | Admitting: Pharmacist Clinician (PhC)/ Clinical Pharmacy Specialist

## 2022-07-29 ENCOUNTER — Encounter (HOSPITAL_BASED_OUTPATIENT_CLINIC_OR_DEPARTMENT_OTHER): Payer: Self-pay | Admitting: Pharmacist Clinician (PhC)/ Clinical Pharmacy Specialist

## 2022-07-29 VITALS — BP 127/82 | HR 86 | Ht 67.0 in | Wt 245.6 lb

## 2022-07-29 DIAGNOSIS — I10 Essential (primary) hypertension: Secondary | ICD-10-CM

## 2022-07-29 NOTE — Assessment & Plan Note (Signed)
Assessment: BP is controlled in office BP 127/82 mmHg;  Currently [redacted]w[redacted]d, due in early November Tolerates nefedipine well without any side effects Denies SOB, palpitation, chest pain, headaches,or swelling Reiterated the importance of regular exercise and low salt diet   Plan:  Continue taking nifedipine xl 60 mg daily Patient to keep record of BP readings with heart rate and report to [place] at the next visit Patient to follow up with Belenda Cruise in 3 months - or call if blood pressure increases to > 140 prior to then  Labs ordered today:  none

## 2022-07-29 NOTE — Progress Notes (Signed)
Office Visit    Patient Name: Kaitlyn Choi Date of Encounter: 07/29/2022  Primary Care Provider:  Jerrol Banana, MD Primary Cardiologist:  None  Chief Complaint    Hypertension - Advanced hypertension clinic  Past Medical History   obesity 1/24 BMI 39.82  Anxiety/depression On citalopram    Allergies  Allergen Reactions   Latex Rash    History of Present Illness    Kaitlyn Choi is a 42 y.o. female patient who was referred to the [facility] by Dr. Maxie Better.  She was seen by Dr. Duke Salvia last month, at which time her BP was good at 112/82.  She was having trouble remembering to take labetalol twice daily, so it was discontinued and she remained on nifedipine xl 30 mg.   At her last visit, BP was improved, but not to goal and nifedipine was increased to 60 mg daily.  Today she is in the office for follow up.  She is now [redacted]w[redacted]d pregnant, due in late October.  She is expecting a boy and there was some initial concern about an elevated MSAFP, but she states there was an error and the baby appears to be healthy at this time.    Blood Pressure Goal:  140/90 (based on ACGO guidelines)  Current Medications: nifedipine xl 60 mg qd  Previously tried:   labetalol - no problems  Family Hx:  father died from stroke in his 45's; mom in good health; all grandparents with hypertension, no siblings  Social Hx:      Tobacco: no  Alcohol: occaisonally  - not recently  Caffeine:  not recently  Diet:  trying to eat "normal"  meats and veggies, more fruit; - avoiding the "eating for 2" concept, no cravings now, did crave Twizzlers in her first trimester  Exercise: none - more movement at home - fostering 2 puppies  Home BP readings:  no home readings with her today, but thinks they have all been at or below 130 systolic.     Accessory Clinical Findings    Lab Results  Component Value Date   CREATININE 0.97 04/16/2021   BUN 9 04/16/2021   NA 137  04/16/2021   K 4.2 04/16/2021   CL 99 04/16/2021   CO2 21 04/16/2021   Lab Results  Component Value Date   ALT 27 04/16/2021   AST 37 04/16/2021   ALKPHOS 61 04/16/2021   BILITOT 1.0 04/16/2021   Lab Results  Component Value Date   HGBA1C 5.6 04/16/2021    Screening for Secondary Hypertension:      03/08/2022    2:37 PM  Causes  Drugs/Herbals Screened     - Comments 1-2 coffees daily.  +EtOH. +Advil  Renovascular HTN N/A  Sleep Apnea Not Screened  Thyroid Disease Screened  Hyperaldosteronism N/A  Pheochromocytoma N/A  Cushing's Syndrome N/A  Hyperparathyroidism N/A  Coarctation of the Aorta Screened     - Comments BP symmmetric  Compliance Screened    Relevant Labs/Studies:    Latest Ref Rng & Units 04/16/2021   10:35 AM 12/09/2020   12:00 AM  Basic Labs  Sodium 134 - 144 mmol/L 137  140      Potassium 3.5 - 5.2 mmol/L 4.2  4.3      Creatinine 0.57 - 1.00 mg/dL 8.29  0.9         This result is from an external source.       Latest Ref Rng & Units 04/16/2021  10:35 AM 12/09/2020   12:00 AM  Thyroid   TSH 0.450 - 4.500 uIU/mL 1.600  1.81         This result is from an external source.                 Home Medications    Current Outpatient Medications  Medication Sig Dispense Refill   albuterol (VENTOLIN HFA) 108 (90 Base) MCG/ACT inhaler Inhale 2 puffs into the lungs every 6 (six) hours as needed for wheezing or shortness of breath (Cough). 18 g 2   aspirin EC 81 MG tablet Take 81 mg by mouth daily. Swallow whole.     MELATONIN PO Take 12 mg by mouth at bedtime.     NIFEdipine (PROCARDIA XL/NIFEDICAL XL) 60 MG 24 hr tablet Take 1 tablet (60 mg total) by mouth daily. 90 tablet 3   sertraline (ZOLOFT) 50 MG tablet Take 50 mg by mouth daily.     albuterol (ACCUNEB) 1.25 MG/3ML nebulizer solution Take 3 mLs (1.25 mg total) by nebulization every 6 (six) hours as needed for wheezing. 360 mL 0   Cetirizine HCl (ZYRTEC PO) Take by mouth.     diphenhydrAMINE  (BENADRYL) 25 MG tablet Take 50 mg by mouth at bedtime as needed.     doxylamine, Sleep, (UNISOM) 25 MG tablet Take 25 mg by mouth at bedtime as needed for sleep.     No current facility-administered medications for this visit.     Assessment & Plan   Primary hypertension Assessment: BP is controlled in office BP 127/82 mmHg;  Currently [redacted]w[redacted]d, due in early November Tolerates nefedipine well without any side effects Denies SOB, palpitation, chest pain, headaches,or swelling Reiterated the importance of regular exercise and low salt diet   Plan:  Continue taking nifedipine xl 60 mg daily Patient to keep record of BP readings with heart rate and report to [place] at the next visit Patient to follow up with Belenda Cruise in 3 months - or call if blood pressure increases to > 140 prior to then  Labs ordered today:  none   Phillips Hay PharmD CPP [facility]  [address] [place], [place] [address] [phone]

## 2022-07-29 NOTE — Patient Instructions (Signed)
Follow up appointment: in 3 months - we'll reach out to you  Take your BP meds as follows: continue with the nifedipine xl 60 mg  Check your blood pressure at home daily (if able) and keep record of the readings.  Hypertension "High blood pressure"  Hypertension is often called "The Silent Killer." It rarely causes symptoms until it is extremely  high or has done damage to other organs in the body. For this reason, you should have your  blood pressure checked regularly by your physician. We will check your blood pressure  every time you see a provider at one of our offices.   Your blood pressure reading consists of two numbers. Ideally, blood pressure should be  below 120/80. The first ("top") number is called the systolic pressure. It measures the  pressure in your arteries as your heart beats. The second ("bottom") number is called the diastolic pressure. It measures the pressure in your arteries as the heart relaxes between beats.  The benefits of getting your blood pressure under control are enormous. A 10-point  reduction in systolic blood pressure can reduce your risk of stroke by 27% and heart failure by 28%  Your blood pressure goal is < 130/80  To check your pressure at home you will need to:  1. Sit up in a chair, with feet flat on the floor and back supported. Do not cross your ankles or legs. 2. Rest your left arm so that the cuff is about heart level. If the cuff goes on your upper arm,  then just relax the arm on the table, arm of the chair or your lap. If you have a wrist cuff, we  suggest relaxing your wrist against your chest (think of it as Pledging the Flag with the  wrong arm).  3. Place the cuff snugly around your arm, about 1 inch above the crook of your elbow. The  cords should be inside the groove of your elbow.  4. Sit quietly, with the cuff in place, for about 5 minutes. After that 5 minutes press the power  button to start a reading. 5. Do not talk or  move while the reading is taking place.  6. Record your readings on a sheet of paper. Although most cuffs have a memory, it is often  easier to see a pattern developing when the numbers are all in front of you.  7. You can repeat the reading after 1-3 minutes if it is recommended  Make sure your bladder is empty and you have not had caffeine or tobacco within the last 30 min  Always bring your blood pressure log with you to your appointments. If you have not brought your monitor in to be double checked for accuracy, please bring it to your next appointment.  You can find a list of quality blood pressure cuffs at [email or web address]

## 2022-08-05 ENCOUNTER — Telehealth: Payer: Self-pay

## 2022-08-05 DIAGNOSIS — O09522 Supervision of elderly multigravida, second trimester: Secondary | ICD-10-CM | POA: Diagnosis not present

## 2022-08-05 DIAGNOSIS — O162 Unspecified maternal hypertension, second trimester: Secondary | ICD-10-CM | POA: Diagnosis not present

## 2022-08-05 NOTE — Telephone Encounter (Signed)
LVM ADVISING PT, HER APPT TIME HAD TO BE CHANGED TO EARLIER IN THE DAY DUE TO BETTER AVAILABILITY. APPT WAS MOVED FROM 3:30PM NURSE VISIT AND 3:45PM [place] APPT TO 1:30 NURSE VISIT AND 1:45 [place] APPT. -[facility]

## 2022-08-10 ENCOUNTER — Ambulatory Visit: Payer: BC Managed Care – PPO | Admitting: *Deleted

## 2022-08-10 ENCOUNTER — Ambulatory Visit: Payer: BC Managed Care – PPO | Attending: Obstetrics

## 2022-08-10 ENCOUNTER — Other Ambulatory Visit: Payer: Self-pay | Admitting: *Deleted

## 2022-08-10 VITALS — BP 121/72 | HR 99

## 2022-08-10 DIAGNOSIS — Z3A19 19 weeks gestation of pregnancy: Secondary | ICD-10-CM

## 2022-08-10 DIAGNOSIS — J45909 Unspecified asthma, uncomplicated: Secondary | ICD-10-CM

## 2022-08-10 DIAGNOSIS — O09522 Supervision of elderly multigravida, second trimester: Secondary | ICD-10-CM

## 2022-08-10 DIAGNOSIS — O35EXX0 Maternal care for other (suspected) fetal abnormality and damage, fetal genitourinary anomalies, not applicable or unspecified: Secondary | ICD-10-CM

## 2022-08-10 DIAGNOSIS — E669 Obesity, unspecified: Secondary | ICD-10-CM

## 2022-08-10 DIAGNOSIS — R772 Abnormality of alphafetoprotein: Secondary | ICD-10-CM

## 2022-08-10 DIAGNOSIS — O99519 Diseases of the respiratory system complicating pregnancy, unspecified trimester: Secondary | ICD-10-CM

## 2022-08-10 DIAGNOSIS — O10912 Unspecified pre-existing hypertension complicating pregnancy, second trimester: Secondary | ICD-10-CM

## 2022-08-10 DIAGNOSIS — O10012 Pre-existing essential hypertension complicating pregnancy, second trimester: Secondary | ICD-10-CM

## 2022-08-10 DIAGNOSIS — O99212 Obesity complicating pregnancy, second trimester: Secondary | ICD-10-CM | POA: Diagnosis not present

## 2022-08-14 ENCOUNTER — Ambulatory Visit: Payer: Self-pay

## 2022-08-14 DIAGNOSIS — J029 Acute pharyngitis, unspecified: Secondary | ICD-10-CM | POA: Diagnosis not present

## 2022-08-14 DIAGNOSIS — R52 Pain, unspecified: Secondary | ICD-10-CM | POA: Diagnosis not present

## 2022-08-14 DIAGNOSIS — Z20822 Contact with and (suspected) exposure to covid-19: Secondary | ICD-10-CM | POA: Diagnosis not present

## 2022-09-01 DIAGNOSIS — O09522 Supervision of elderly multigravida, second trimester: Secondary | ICD-10-CM | POA: Diagnosis not present

## 2022-09-15 ENCOUNTER — Ambulatory Visit: Payer: BC Managed Care – PPO | Attending: Maternal & Fetal Medicine

## 2022-09-15 ENCOUNTER — Ambulatory Visit: Payer: BC Managed Care – PPO | Admitting: *Deleted

## 2022-09-15 ENCOUNTER — Other Ambulatory Visit: Payer: Self-pay | Admitting: *Deleted

## 2022-09-15 VITALS — BP 118/63 | HR 89

## 2022-09-15 DIAGNOSIS — O10012 Pre-existing essential hypertension complicating pregnancy, second trimester: Secondary | ICD-10-CM | POA: Diagnosis not present

## 2022-09-15 DIAGNOSIS — O35EXX0 Maternal care for other (suspected) fetal abnormality and damage, fetal genitourinary anomalies, not applicable or unspecified: Secondary | ICD-10-CM

## 2022-09-15 DIAGNOSIS — O09522 Supervision of elderly multigravida, second trimester: Secondary | ICD-10-CM

## 2022-09-15 DIAGNOSIS — O281 Abnormal biochemical finding on antenatal screening of mother: Secondary | ICD-10-CM

## 2022-09-15 DIAGNOSIS — O10912 Unspecified pre-existing hypertension complicating pregnancy, second trimester: Secondary | ICD-10-CM | POA: Insufficient documentation

## 2022-09-15 DIAGNOSIS — R772 Abnormality of alphafetoprotein: Secondary | ICD-10-CM | POA: Diagnosis not present

## 2022-09-15 DIAGNOSIS — O99212 Obesity complicating pregnancy, second trimester: Secondary | ICD-10-CM

## 2022-09-15 DIAGNOSIS — E669 Obesity, unspecified: Secondary | ICD-10-CM

## 2022-09-15 DIAGNOSIS — J45909 Unspecified asthma, uncomplicated: Secondary | ICD-10-CM

## 2022-09-15 DIAGNOSIS — O99512 Diseases of the respiratory system complicating pregnancy, second trimester: Secondary | ICD-10-CM

## 2022-09-15 DIAGNOSIS — Z3A24 24 weeks gestation of pregnancy: Secondary | ICD-10-CM

## 2022-09-29 DIAGNOSIS — O162 Unspecified maternal hypertension, second trimester: Secondary | ICD-10-CM | POA: Diagnosis not present

## 2022-09-29 DIAGNOSIS — O09522 Supervision of elderly multigravida, second trimester: Secondary | ICD-10-CM | POA: Diagnosis not present

## 2022-10-11 DIAGNOSIS — O163 Unspecified maternal hypertension, third trimester: Secondary | ICD-10-CM | POA: Diagnosis not present

## 2022-10-11 DIAGNOSIS — O09523 Supervision of elderly multigravida, third trimester: Secondary | ICD-10-CM | POA: Diagnosis not present

## 2022-10-13 DIAGNOSIS — Z3689 Encounter for other specified antenatal screening: Secondary | ICD-10-CM | POA: Diagnosis not present

## 2022-10-15 ENCOUNTER — Ambulatory Visit: Payer: BC Managed Care – PPO

## 2022-10-15 ENCOUNTER — Other Ambulatory Visit: Payer: Self-pay | Admitting: *Deleted

## 2022-10-15 ENCOUNTER — Ambulatory Visit: Payer: BC Managed Care – PPO | Admitting: *Deleted

## 2022-10-15 VITALS — BP 115/69 | HR 110

## 2022-10-15 DIAGNOSIS — O09523 Supervision of elderly multigravida, third trimester: Secondary | ICD-10-CM | POA: Diagnosis not present

## 2022-10-15 DIAGNOSIS — Z3A28 28 weeks gestation of pregnancy: Secondary | ICD-10-CM

## 2022-10-15 DIAGNOSIS — J45909 Unspecified asthma, uncomplicated: Secondary | ICD-10-CM

## 2022-10-15 DIAGNOSIS — O99213 Obesity complicating pregnancy, third trimester: Secondary | ICD-10-CM

## 2022-10-15 DIAGNOSIS — R772 Abnormality of alphafetoprotein: Secondary | ICD-10-CM

## 2022-10-15 DIAGNOSIS — O99513 Diseases of the respiratory system complicating pregnancy, third trimester: Secondary | ICD-10-CM

## 2022-10-15 DIAGNOSIS — O281 Abnormal biochemical finding on antenatal screening of mother: Secondary | ICD-10-CM

## 2022-10-15 DIAGNOSIS — O09522 Supervision of elderly multigravida, second trimester: Secondary | ICD-10-CM | POA: Insufficient documentation

## 2022-10-15 DIAGNOSIS — O10013 Pre-existing essential hypertension complicating pregnancy, third trimester: Secondary | ICD-10-CM

## 2022-10-15 DIAGNOSIS — O10912 Unspecified pre-existing hypertension complicating pregnancy, second trimester: Secondary | ICD-10-CM | POA: Diagnosis not present

## 2022-10-15 DIAGNOSIS — E669 Obesity, unspecified: Secondary | ICD-10-CM

## 2022-10-15 DIAGNOSIS — O10913 Unspecified pre-existing hypertension complicating pregnancy, third trimester: Secondary | ICD-10-CM

## 2022-10-19 ENCOUNTER — Ambulatory Visit (INDEPENDENT_AMBULATORY_CARE_PROVIDER_SITE_OTHER): Payer: BC Managed Care – PPO | Admitting: Pharmacist Clinician (PhC)/ Clinical Pharmacy Specialist

## 2022-10-19 VITALS — BP 117/73 | HR 96

## 2022-10-19 DIAGNOSIS — I10 Essential (primary) hypertension: Secondary | ICD-10-CM

## 2022-10-19 NOTE — Progress Notes (Unsigned)
Office Visit    Patient Name: Kaitlyn Choi Date of Encounter: 10/20/2022  Primary Care Provider:  Jerrol Banana, MD Primary Cardiologist:  None  Chief Complaint    Hypertension - Advanced hypertension clinic  Past Medical History   obesity 1/24 BMI 39.82  Anxiety/depression On sertraline    Allergies  Allergen Reactions   Latex Rash    History of Present Illness    Kaitlyn Choi is a 42 y.o. female patient who was referred to the [facility]pertension [facility] by Dr. Maxie Better.  She was seen by Dr. Duke Salvia last month, at which time her BP was good at 112/82.  She was having trouble remembering to take labetalol twice daily, so it was discontinued and she remained on nifedipine xl 30 mg.   At her last visit, BP was improved, but not to goal and nifedipine was increased to 60 mg daily.  I saw her last in May (17s5d) and she was doing well at that time.  Blood pressure was well controlled and no changes were made to medications.  She is expecting a boy and there was some initial concern about an elevated MSAFP, but she states there was an error and the baby appears to be healthy at this time.    Today she is in the office for follow up.  She is now [redacted]w[redacted]d pregnant, due in late October.   Notes that she has some problems with feeling bloated, now tends to graze throughout the day rather than eat regular meals.    Blood Pressure Goal:  140/90 (based on ACGO guidelines)  Current Medications: nifedipine xl 60 mg qd  Previously tried:   labetalol - no problems  Family Hx:  father died from stroke in his 57's; mom in good health; all grandparents with hypertension, no siblings  Social Hx:      Tobacco: no  Alcohol: occaisonally  - not recently  Caffeine:  not recently  Diet:  trying to eat "normal"  meats and veggies, more fruit; - avoiding the "eating for 2" concept, no cravings now, did crave Twizzlers in her first trimester  Exercise: none - more movement at home -  fostering 2 puppies  Home BP readings:  no home readings with her today, but thinks they have all been at or below 130 systolic.     Accessory Clinical Findings    Lab Results  Component Value Date   CREATININE 0.97 04/16/2021   BUN 9 04/16/2021   NA 137 04/16/2021   K 4.2 04/16/2021   CL 99 04/16/2021   CO2 21 04/16/2021   Lab Results  Component Value Date   ALT 27 04/16/2021   AST 37 04/16/2021   ALKPHOS 61 04/16/2021   BILITOT 1.0 04/16/2021   Lab Results  Component Value Date   HGBA1C 5.6 04/16/2021    Screening for Secondary Hypertension:      03/08/2022    2:37 PM  Causes  Drugs/Herbals Screened     - Comments 1-2 coffees daily.  +EtOH. +Advil  Renovascular HTN N/A  Sleep Apnea Not Screened  Thyroid Disease Screened  Hyperaldosteronism N/A  Pheochromocytoma N/A  Cushing's Syndrome N/A  Hyperparathyroidism N/A  Coarctation of the Aorta Screened     - Comments BP symmmetric  Compliance Screened    Relevant Labs/Studies:    Latest Ref Rng & Units 04/16/2021   10:35 AM 12/09/2020   12:00 AM  Basic Labs  Sodium 134 - 144 mmol/L 137  140  Potassium 3.5 - 5.2 mmol/L 4.2  4.3      Creatinine 0.57 - 1.00 mg/dL 1.61  0.9         This result is from an external source.       Latest Ref Rng & Units 04/16/2021   10:35 AM 12/09/2020   12:00 AM  Thyroid   TSH 0.450 - 4.500 uIU/mL 1.600  1.81         This result is from an external source.                 Home Medications    Current Outpatient Medications  Medication Sig Dispense Refill   albuterol (ACCUNEB) 1.25 MG/3ML nebulizer solution Take 3 mLs (1.25 mg total) by nebulization every 6 (six) hours as needed for wheezing. 360 mL 0   albuterol (VENTOLIN HFA) 108 (90 Base) MCG/ACT inhaler Inhale 2 puffs into the lungs every 6 (six) hours as needed for wheezing or shortness of breath (Cough). 18 g 2   aspirin EC 81 MG tablet Take 81 mg by mouth daily. Swallow whole.     Cetirizine HCl (ZYRTEC PO)  Take by mouth.     diphenhydrAMINE (BENADRYL) 25 MG tablet Take 50 mg by mouth at bedtime as needed.     doxylamine, Sleep, (UNISOM) 25 MG tablet Take 25 mg by mouth at bedtime as needed for sleep.     ferrous sulfate 325 (65 FE) MG EC tablet Take 325 mg by mouth 3 (three) times daily with meals.     MELATONIN PO Take 12 mg by mouth at bedtime.     NIFEdipine (PROCARDIA XL/NIFEDICAL XL) 60 MG 24 hr tablet Take 1 tablet (60 mg total) by mouth daily. 90 tablet 3   sertraline (ZOLOFT) 50 MG tablet Take 50 mg by mouth daily.     vitamin C (ASCORBIC ACID) 250 MG tablet Take 500 mg by mouth daily.     No current facility-administered medications for this visit.     Assessment & Plan   Primary hypertension Assessment: BP is controlled in office BP 117/73 mmHg;  above the goal (<130/80). Tolerates nifedipine xl 30 mg well without any side effects Denies SOB, palpitation, chest pain, headaches,or swelling Reiterated the importance of regular exercise and low salt diet   Plan:  Continue taking nifedipine Patient to keep record of BP readings with heart rate and report to [place] at the next visit Patient to follow up with PharmD in 1 month  Labs ordered today:  none   Phillips Hay PharmD CPP [facility]  [address] [place], [place] [address] [phone]

## 2022-10-19 NOTE — Patient Instructions (Signed)
Follow up appointment: Sept 24 at 3 pm  Take your BP meds as follows: continue with nifedipine xl 60 mg  Check your blood pressure at home daily (if able) and keep record of the readings.  Hypertension "High blood pressure"  Hypertension is often called "The Silent Killer." It rarely causes symptoms until it is extremely  high or has done damage to other organs in the body. For this reason, you should have your  blood pressure checked regularly by your physician. We will check your blood pressure  every time you see a provider at one of our offices.   Your blood pressure reading consists of two numbers. Ideally, blood pressure should be  below 120/80. The first ("top") number is called the systolic pressure. It measures the  pressure in your arteries as your heart beats. The second ("bottom") number is called the diastolic pressure. It measures the pressure in your arteries as the heart relaxes between beats.  The benefits of getting your blood pressure under control are enormous. A 10-point  reduction in systolic blood pressure can reduce your risk of stroke by 27% and heart failure by 28%  Your blood pressure goal is < 140/90  To check your pressure at home you will need to:  1. Sit up in a chair, with feet flat on the floor and back supported. Do not cross your ankles or legs. 2. Rest your left arm so that the cuff is about heart level. If the cuff goes on your upper arm,  then just relax the arm on the table, arm of the chair or your lap. If you have a wrist cuff, we  suggest relaxing your wrist against your chest (think of it as Pledging the Flag with the  wrong arm).  3. Place the cuff snugly around your arm, about 1 inch above the crook of your elbow. The  cords should be inside the groove of your elbow.  4. Sit quietly, with the cuff in place, for about 5 minutes. After that 5 minutes press the power  button to start a reading. 5. Do not talk or move while the reading is  taking place.  6. Record your readings on a sheet of paper. Although most cuffs have a memory, it is often  easier to see a pattern developing when the numbers are all in front of you.  7. You can repeat the reading after 1-3 minutes if it is recommended  Make sure your bladder is empty and you have not had caffeine or tobacco within the last 30 min  Always bring your blood pressure log with you to your appointments. If you have not brought your monitor in to be double checked for accuracy, please bring it to your next appointment.  You can find a list of quality blood pressure cuffs at [email or web address]

## 2022-10-20 ENCOUNTER — Encounter (HOSPITAL_BASED_OUTPATIENT_CLINIC_OR_DEPARTMENT_OTHER): Payer: Self-pay | Admitting: Pharmacist Clinician (PhC)/ Clinical Pharmacy Specialist

## 2022-10-20 NOTE — Assessment & Plan Note (Signed)
Assessment: BP is controlled in office BP 117/73 mmHg;  above the goal (<130/80). Tolerates nifedipine xl 30 mg well without any side effects Denies SOB, palpitation, chest pain, headaches,or swelling Reiterated the importance of regular exercise and low salt diet   Plan:  Continue taking nifedipine Patient to keep record of BP readings with heart rate and report to [place] at the next visit Patient to follow up with PharmD in 1 month  Labs ordered today:  none

## 2022-10-22 DIAGNOSIS — O99810 Abnormal glucose complicating pregnancy: Secondary | ICD-10-CM | POA: Diagnosis not present

## 2022-11-04 DIAGNOSIS — Z23 Encounter for immunization: Secondary | ICD-10-CM | POA: Diagnosis not present

## 2022-11-04 DIAGNOSIS — O09893 Supervision of other high risk pregnancies, third trimester: Secondary | ICD-10-CM | POA: Diagnosis not present

## 2022-11-04 DIAGNOSIS — O163 Unspecified maternal hypertension, third trimester: Secondary | ICD-10-CM | POA: Diagnosis not present

## 2022-11-11 ENCOUNTER — Ambulatory Visit: Payer: BC Managed Care – PPO | Attending: Maternal & Fetal Medicine

## 2022-11-11 ENCOUNTER — Ambulatory Visit: Payer: BC Managed Care – PPO | Admitting: *Deleted

## 2022-11-11 VITALS — BP 119/61 | HR 84

## 2022-11-11 DIAGNOSIS — O10013 Pre-existing essential hypertension complicating pregnancy, third trimester: Secondary | ICD-10-CM

## 2022-11-11 DIAGNOSIS — O09523 Supervision of elderly multigravida, third trimester: Secondary | ICD-10-CM | POA: Diagnosis not present

## 2022-11-11 DIAGNOSIS — O10912 Unspecified pre-existing hypertension complicating pregnancy, second trimester: Secondary | ICD-10-CM | POA: Insufficient documentation

## 2022-11-11 DIAGNOSIS — O09522 Supervision of elderly multigravida, second trimester: Secondary | ICD-10-CM | POA: Diagnosis not present

## 2022-11-11 DIAGNOSIS — O285 Abnormal chromosomal and genetic finding on antenatal screening of mother: Secondary | ICD-10-CM

## 2022-11-11 DIAGNOSIS — O99213 Obesity complicating pregnancy, third trimester: Secondary | ICD-10-CM | POA: Diagnosis not present

## 2022-11-11 DIAGNOSIS — E669 Obesity, unspecified: Secondary | ICD-10-CM

## 2022-11-11 DIAGNOSIS — Z3A32 32 weeks gestation of pregnancy: Secondary | ICD-10-CM

## 2022-11-16 DIAGNOSIS — O09893 Supervision of other high risk pregnancies, third trimester: Secondary | ICD-10-CM | POA: Diagnosis not present

## 2022-11-16 DIAGNOSIS — O163 Unspecified maternal hypertension, third trimester: Secondary | ICD-10-CM | POA: Diagnosis not present

## 2022-11-19 ENCOUNTER — Ambulatory Visit: Payer: BC Managed Care – PPO | Attending: Maternal & Fetal Medicine | Admitting: *Deleted

## 2022-11-19 ENCOUNTER — Ambulatory Visit: Payer: BC Managed Care – PPO

## 2022-11-19 DIAGNOSIS — Z3A33 33 weeks gestation of pregnancy: Secondary | ICD-10-CM

## 2022-11-19 DIAGNOSIS — O09523 Supervision of elderly multigravida, third trimester: Secondary | ICD-10-CM | POA: Diagnosis not present

## 2022-11-19 DIAGNOSIS — O10913 Unspecified pre-existing hypertension complicating pregnancy, third trimester: Secondary | ICD-10-CM | POA: Diagnosis not present

## 2022-11-19 DIAGNOSIS — R772 Abnormality of alphafetoprotein: Secondary | ICD-10-CM | POA: Insufficient documentation

## 2022-11-19 DIAGNOSIS — O09522 Supervision of elderly multigravida, second trimester: Secondary | ICD-10-CM

## 2022-11-19 NOTE — Procedures (Signed)
Kaitlyn Choi 09-Apr-1980 [redacted]w[redacted]d  Fetus A Non-Stress Test Interpretation for 11/19/22   NST only  Indication: Advanced Maternal Age >40 years  Fetal Heart Rate A Mode: External Baseline Rate (A): 135 bpm Variability: Moderate Accelerations: 15 x 15 Decelerations: None Multiple birth?: No  Uterine Activity Mode: Palpation, Toco Contraction Frequency (min): 2 ucs with ui Contraction Duration (sec): 50-60 Contraction Quality: Mild Resting Tone Palpated: Relaxed Resting Time: Adequate  Interpretation (Fetal Testing) Nonstress Test Interpretation: Reactive Overall Impression: Reassuring for gestational age Comments: Dr. Darra Lis reviewed tracing

## 2022-11-23 ENCOUNTER — Ambulatory Visit (HOSPITAL_BASED_OUTPATIENT_CLINIC_OR_DEPARTMENT_OTHER): Payer: BC Managed Care – PPO

## 2022-11-23 NOTE — Progress Notes (Deleted)
Office Visit    Patient Name: Kaitlyn Choi Date of Encounter: 11/23/2022  Primary Care Provider:  Jerrol Banana, MD Primary Cardiologist:  None  Chief Complaint    Hypertension - Advanced hypertension clinic  Past Medical History   obesity 1/24 BMI 39.82  Anxiety/depression On sertraline    Allergies  Allergen Reactions   Latex Rash    History of Present Illness    Kaitlyn Choi is a 42 y.o. female patient who was referred to the [facility] by Dr. Maxie Better.  She was seen by Dr. Duke Salvia last month, at which time her BP was good at 112/82.  She was having trouble remembering to take labetalol twice daily, so it was discontinued and she remained on nifedipine xl 30 mg.   At her last visit, BP was improved, but not to goal and nifedipine was increased to 60 mg daily.  I saw her last in May (17s5d) and she was doing well at that time.  Blood pressure was well controlled and no changes were made to medications.  She is expecting a boy and there was some initial concern about an elevated MSAFP, but she states there was an error and the baby appears to be healthy at this time.    Today she is in the office for follow up.  She is now [redacted]w[redacted]d pregnant, due in late October.   Notes that she has some problems with feeling bloated, now tends to graze throughout the day rather than eat regular meals.    Blood Pressure Goal:  140/90 (based on ACGO guidelines)  Current Medications: nifedipine xl 60 mg qd  Previously tried:   labetalol - no problems  Family Hx:  father died from stroke in his 19's; mom in good health; all grandparents with hypertension, no siblings  Social Hx:      Tobacco: no  Alcohol: occaisonally  - not recently  Caffeine:  not recently  Diet:  trying to eat "normal"  meats and veggies, more fruit; - avoiding the "eating for 2" concept, no cravings now, did crave Twizzlers in her first trimester  Exercise: none - more movement at home -  fostering 2 puppies  Home BP readings:  no home readings with her today, but thinks they have all been at or below 130 systolic.     Accessory Clinical Findings    Lab Results  Component Value Date   CREATININE 0.97 04/16/2021   BUN 9 04/16/2021   NA 137 04/16/2021   K 4.2 04/16/2021   CL 99 04/16/2021   CO2 21 04/16/2021   Lab Results  Component Value Date   ALT 27 04/16/2021   AST 37 04/16/2021   ALKPHOS 61 04/16/2021   BILITOT 1.0 04/16/2021   Lab Results  Component Value Date   HGBA1C 5.6 04/16/2021    Screening for Secondary Hypertension:      03/08/2022    2:37 PM  Causes  Drugs/Herbals Screened     - Comments 1-2 coffees daily.  +EtOH. +Advil  Renovascular HTN N/A  Sleep Apnea Not Screened  Thyroid Disease Screened  Hyperaldosteronism N/A  Pheochromocytoma N/A  Cushing's Syndrome N/A  Hyperparathyroidism N/A  Coarctation of the Aorta Screened     - Comments BP symmmetric  Compliance Screened    Relevant Labs/Studies:    Latest Ref Rng & Units 04/16/2021   10:35 AM 12/09/2020   12:00 AM  Basic Labs  Sodium 134 - 144 mmol/L 137  140  Potassium 3.5 - 5.2 mmol/L 4.2  4.3      Creatinine 0.57 - 1.00 mg/dL 1.91  0.9         This result is from an external source.       Latest Ref Rng & Units 04/16/2021   10:35 AM 12/09/2020   12:00 AM  Thyroid   TSH 0.450 - 4.500 uIU/mL 1.600  1.81         This result is from an external source.                 Home Medications    Current Outpatient Medications  Medication Sig Dispense Refill   albuterol (ACCUNEB) 1.25 MG/3ML nebulizer solution Take 3 mLs (1.25 mg total) by nebulization every 6 (six) hours as needed for wheezing. 360 mL 0   albuterol (VENTOLIN HFA) 108 (90 Base) MCG/ACT inhaler Inhale 2 puffs into the lungs every 6 (six) hours as needed for wheezing or shortness of breath (Cough). 18 g 2   aspirin EC 81 MG tablet Take 81 mg by mouth daily. Swallow whole.     Cetirizine HCl (ZYRTEC PO)  Take by mouth.     diphenhydrAMINE (BENADRYL) 25 MG tablet Take 50 mg by mouth at bedtime as needed.     doxylamine, Sleep, (UNISOM) 25 MG tablet Take 25 mg by mouth at bedtime as needed for sleep.     ferrous sulfate 325 (65 FE) MG EC tablet Take 325 mg by mouth 3 (three) times daily with meals.     MELATONIN PO Take 12 mg by mouth at bedtime.     NIFEdipine (PROCARDIA XL/NIFEDICAL XL) 60 MG 24 hr tablet Take 1 tablet (60 mg total) by mouth daily. 90 tablet 3   sertraline (ZOLOFT) 50 MG tablet Take 50 mg by mouth daily.     vitamin C (ASCORBIC ACID) 250 MG tablet Take 500 mg by mouth daily.     No current facility-administered medications for this visit.     Assessment & Plan   No problem-specific Assessment & Plan notes found for this encounter.   Phillips Hay PharmD CPP [facility]  [address] [place], [place] [address] [phone]

## 2022-11-26 ENCOUNTER — Ambulatory Visit: Payer: BC Managed Care – PPO | Attending: Maternal & Fetal Medicine

## 2022-11-26 ENCOUNTER — Ambulatory Visit: Payer: BC Managed Care – PPO

## 2022-11-26 DIAGNOSIS — O09523 Supervision of elderly multigravida, third trimester: Secondary | ICD-10-CM | POA: Diagnosis not present

## 2022-11-26 DIAGNOSIS — R772 Abnormality of alphafetoprotein: Secondary | ICD-10-CM | POA: Diagnosis not present

## 2022-11-26 DIAGNOSIS — O281 Abnormal biochemical finding on antenatal screening of mother: Secondary | ICD-10-CM | POA: Diagnosis not present

## 2022-11-26 DIAGNOSIS — O10913 Unspecified pre-existing hypertension complicating pregnancy, third trimester: Secondary | ICD-10-CM | POA: Diagnosis not present

## 2022-11-26 DIAGNOSIS — O10013 Pre-existing essential hypertension complicating pregnancy, third trimester: Secondary | ICD-10-CM

## 2022-11-26 DIAGNOSIS — J45909 Unspecified asthma, uncomplicated: Secondary | ICD-10-CM

## 2022-11-26 DIAGNOSIS — O99213 Obesity complicating pregnancy, third trimester: Secondary | ICD-10-CM

## 2022-11-26 DIAGNOSIS — Z3A34 34 weeks gestation of pregnancy: Secondary | ICD-10-CM

## 2022-11-26 DIAGNOSIS — O99513 Diseases of the respiratory system complicating pregnancy, third trimester: Secondary | ICD-10-CM

## 2022-11-29 ENCOUNTER — Other Ambulatory Visit: Payer: Self-pay | Admitting: *Deleted

## 2022-11-29 DIAGNOSIS — O09523 Supervision of elderly multigravida, third trimester: Secondary | ICD-10-CM

## 2022-11-29 DIAGNOSIS — O10913 Unspecified pre-existing hypertension complicating pregnancy, third trimester: Secondary | ICD-10-CM

## 2022-12-01 ENCOUNTER — Ambulatory Visit (HOSPITAL_BASED_OUTPATIENT_CLINIC_OR_DEPARTMENT_OTHER): Payer: BC Managed Care – PPO | Admitting: Pharmacist Clinician (PhC)/ Clinical Pharmacy Specialist

## 2022-12-01 ENCOUNTER — Encounter (HOSPITAL_BASED_OUTPATIENT_CLINIC_OR_DEPARTMENT_OTHER): Payer: Self-pay | Admitting: *Deleted

## 2022-12-01 ENCOUNTER — Encounter (HOSPITAL_BASED_OUTPATIENT_CLINIC_OR_DEPARTMENT_OTHER): Payer: Self-pay | Admitting: Pharmacist Clinician (PhC)/ Clinical Pharmacy Specialist

## 2022-12-01 VITALS — BP 118/81

## 2022-12-01 DIAGNOSIS — I10 Essential (primary) hypertension: Secondary | ICD-10-CM

## 2022-12-01 NOTE — Assessment & Plan Note (Signed)
Assessment: Chronic hypertension of pregnancy BP is controlled in office BP 118/81 mmHg;  above the goal (<130/80). Now [redacted]w[redacted]d, no indication of preeclampsia or gestational diabetes Tolerates nifedipine xl well without any side effects Denies SOB, palpitation, chest pain, headaches,or swelling Reiterated the importance of regular exercise and low salt diet   Plan:  Continue taking nifedipine xl 60 mg daily Follow up with Dr. Duke Salvia about 2 months post-partum unless complications with BP arise

## 2022-12-01 NOTE — Progress Notes (Signed)
Office Visit    Patient Name: Kaitlyn Choi Date of Encounter: 12/01/2022  Primary Care Provider:  Jerrol Banana, MD Primary Cardiologist:  None  Chief Complaint    Hypertension - Advanced hypertension clinic  Past Medical History   obesity 1/24 BMI 39.82  Anxiety/depression On sertraline    Allergies  Allergen Reactions   Latex Rash    History of Present Illness    Kaitlyn Choi is a 42 y.o. female patient who was referred to the [facility]pert[facility] Clinic by Dr. Maxie Better.  She was seen by Dr. Duke Salvia last month, at which time her BP was good at 112/82.  She was having trouble remembering to take labetalol twice daily, so it was discontinued and she remained on nifedipine xl 30 mg.   At her last visit, BP was improved, but not to goal and nifedipine was increased to 60 mg daily.  I saw her last in May (17s5d) and she was doing well at that time.  Blood pressure was well controlled and no changes were made to medications.  She is expecting a boy and there was some initial concern about an elevated MSAFP, but she states there was an error and the baby appears to be healthy at this time.  At last visit ([redacted]w[redacted]d) was feeling well and pressure well controlled on nifedipine.  Today she is in the office for follow up.  She is now [redacted]w[redacted]d pregnant, due later this month.   Baby is growing well and up to 5 pounds at last check.  Notes she will be induced at 39 weeks.  Complains of heartburn and not being able to eat as much in the past few weeks as he has really started to grow.   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  Home BP readings:  no home readings with her, all office visit appointments have been good    Accessory Clinical Findings    Lab Results  Component Value Date   CREATININE 0.97 04/16/2021   BUN 9 04/16/2021   NA 137 04/16/2021   K 4.2 04/16/2021   CL 99 04/16/2021   CO2 21 04/16/2021   Lab Results  Component Value Date   ALT 27 04/16/2021   AST 37 04/16/2021   ALKPHOS 61 04/16/2021   BILITOT 1.0 04/16/2021   Lab Results  Component Value Date   HGBA1C 5.6 04/16/2021    Screening for Secondary Hypertension:      03/08/2022    2:37 PM  Causes  Drugs/Herbals Screened     - Comments 1-2 coffees daily.  +EtOH. +Advil  Renovascular HTN N/A  Sleep Apnea Not Screened  Thyroid Disease Screened  Hyperaldosteronism N/A  Pheochromocytoma N/A  Cushing's Syndrome N/A  Hyperparathyroidism N/A  Coarctation of the Aorta Screened     - Comments BP symmmetric  Compliance Screened    Relevant Labs/Studies:    Latest Ref Rng & Units 04/16/2021   10:35 AM 12/09/2020   12:00 AM  Basic  Labs  Sodium 134 - 144 mmol/L 137  140      Potassium 3.5 - 5.2 mmol/L 4.2  4.3      Creatinine 0.57 - 1.00 mg/dL 1.61  0.9         This result is from an external source.       Latest Ref Rng & Units 04/16/2021   10:35 AM 12/09/2020   12:00 AM  Thyroid   TSH 0.450 - 4.500 uIU/mL 1.600  1.81         This result is from an external source.                 Home Medications    Current Outpatient Medications  Medication Sig Dispense Refill   albuterol (ACCUNEB) 1.25 MG/3ML nebulizer solution Take 3 mLs (1.25 mg total) by nebulization every 6 (six) hours as needed for wheezing. 360 mL 0   albuterol (VENTOLIN HFA) 108 (90 Base) MCG/ACT inhaler Inhale 2 puffs into the lungs every 6 (six) hours as needed for wheezing or shortness of breath (Cough). 18 g 2   aspirin EC 81 MG tablet Take 81 mg by mouth  daily. Swallow whole.     Cetirizine HCl (ZYRTEC PO) Take by mouth.     diphenhydrAMINE (BENADRYL) 25 MG tablet Take 50 mg by mouth at bedtime as needed.     doxylamine, Sleep, (UNISOM) 25 MG tablet Take 25 mg by mouth at bedtime as needed for sleep.     ferrous sulfate 325 (65 FE) MG EC tablet Take 325 mg by mouth 3 (three) times daily with meals.     MELATONIN PO Take 12 mg by mouth at bedtime.     NIFEdipine (PROCARDIA XL/NIFEDICAL XL) 60 MG 24 hr tablet Take 1 tablet (60 mg total) by mouth daily. 90 tablet 3   sertraline (ZOLOFT) 50 MG tablet Take 50 mg by mouth daily.     vitamin C (ASCORBIC ACID) 250 MG tablet Take 500 mg by mouth daily.     No current facility-administered medications for this visit.     Assessment & Plan   Primary hypertension Assessment: Chronic hypertension of pregnancy BP is controlled in office BP 118/81 mmHg;  above the goal (<130/80). Now [redacted]w[redacted]d, no indication of preeclampsia or gestational diabetes Tolerates nifedipine xl well without any side effects Denies SOB, palpitation, chest pain, headaches,or swelling Reiterated the importance of regular exercise and low salt diet   Plan:  Continue taking nifedipine xl 60 mg daily Follow up with Dr. Duke Salvia about 2 months post-partum unless complications with BP arise   Phillips Hay PharmD CPP [facility]  [address] [place], [place] [address] [phone]

## 2022-12-01 NOTE — Patient Instructions (Signed)
Follow up appointment: with Dr. Duke Salvia about 2 months post partum  Take your BP meds as follows: continue with nifedipine xl 60 mg daily  Check your blood pressure at home daily (if able) and keep record of the readings.  Hypertension "High blood pressure"  Hypertension is often called "The Silent Killer." It rarely causes symptoms until it is extremely  high or has done damage to other organs in the body. For this reason, you should have your  blood pressure checked regularly by your physician. We will check your blood pressure  every time you see a provider at one of our offices.   Your blood pressure reading consists of two numbers. Ideally, blood pressure should be  below 120/80. The first ("top") number is called the systolic pressure. It measures the  pressure in your arteries as your heart beats. The second ("bottom") number is called the diastolic pressure. It measures the pressure in your arteries as the heart relaxes between beats.  The benefits of getting your blood pressure under control are enormous. A 10-point  reduction in systolic blood pressure can reduce your risk of stroke by 27% and heart failure by 28%  Your blood pressure goal is < 140/90  To check your pressure at home you will need to:  1. Sit up in a chair, with feet flat on the floor and back supported. Do not cross your ankles or legs. 2. Rest your left arm so that the cuff is about heart level. If the cuff goes on your upper arm,  then just relax the arm on the table, arm of the chair or your lap. If you have a wrist cuff, we  suggest relaxing your wrist against your chest (think of it as Pledging the Flag with the  wrong arm).  3. Place the cuff snugly around your arm, about 1 inch above the crook of your elbow. The  cords should be inside the groove of your elbow.  4. Sit quietly, with the cuff in place, for about 5 minutes. After that 5 minutes press the power  button to start a reading. 5. Do not  talk or move while the reading is taking place.  6. Record your readings on a sheet of paper. Although most cuffs have a memory, it is often  easier to see a pattern developing when the numbers are all in front of you.  7. You can repeat the reading after 1-3 minutes if it is recommended  Make sure your bladder is empty and you have not had caffeine or tobacco within the last 30 min  Always bring your blood pressure log with you to your appointments. If you have not brought your monitor in to be double checked for accuracy, please bring it to your next appointment.  You can find a list of quality blood pressure cuffs at validatebp.org

## 2022-12-02 DIAGNOSIS — O163 Unspecified maternal hypertension, third trimester: Secondary | ICD-10-CM | POA: Diagnosis not present

## 2022-12-02 DIAGNOSIS — Z3685 Encounter for antenatal screening for Streptococcus B: Secondary | ICD-10-CM | POA: Diagnosis not present

## 2022-12-02 DIAGNOSIS — O09893 Supervision of other high risk pregnancies, third trimester: Secondary | ICD-10-CM | POA: Diagnosis not present

## 2022-12-03 ENCOUNTER — Ambulatory Visit: Payer: BC Managed Care – PPO

## 2022-12-03 ENCOUNTER — Ambulatory Visit: Payer: BC Managed Care – PPO | Attending: Obstetrics | Admitting: *Deleted

## 2022-12-03 ENCOUNTER — Ambulatory Visit (HOSPITAL_BASED_OUTPATIENT_CLINIC_OR_DEPARTMENT_OTHER): Payer: BC Managed Care – PPO | Admitting: *Deleted

## 2022-12-03 VITALS — BP 109/68 | HR 98

## 2022-12-03 DIAGNOSIS — O09523 Supervision of elderly multigravida, third trimester: Secondary | ICD-10-CM | POA: Diagnosis not present

## 2022-12-03 DIAGNOSIS — O99213 Obesity complicating pregnancy, third trimester: Secondary | ICD-10-CM | POA: Diagnosis not present

## 2022-12-03 DIAGNOSIS — O281 Abnormal biochemical finding on antenatal screening of mother: Secondary | ICD-10-CM | POA: Insufficient documentation

## 2022-12-03 DIAGNOSIS — O10913 Unspecified pre-existing hypertension complicating pregnancy, third trimester: Secondary | ICD-10-CM | POA: Diagnosis not present

## 2022-12-03 DIAGNOSIS — Z3A35 35 weeks gestation of pregnancy: Secondary | ICD-10-CM | POA: Diagnosis not present

## 2022-12-03 NOTE — Procedures (Signed)
Kaitlyn Choi 1981-01-27 [redacted]w[redacted]d  Fetus A Non-Stress Test Interpretation for 12/03/22-NST only  Indication: Advanced Maternal Age >40 years and obese  Fetal Heart Rate A Mode: External Baseline Rate (A): 130 bpm Variability: Moderate Accelerations: 15 x 15 Decelerations: None Multiple birth?: No  Uterine Activity Mode: Toco Contraction Frequency (min): irreg Contraction Duration (sec): 40-50 Contraction Quality: Mild Resting Tone Palpated: Relaxed  Interpretation (Fetal Testing) Nonstress Test Interpretation: Reactive Comments: Tracing reviewed by Dr. Parke Poisson

## 2022-12-08 DIAGNOSIS — O09523 Supervision of elderly multigravida, third trimester: Secondary | ICD-10-CM | POA: Diagnosis not present

## 2022-12-08 DIAGNOSIS — O163 Unspecified maternal hypertension, third trimester: Secondary | ICD-10-CM | POA: Diagnosis not present

## 2022-12-10 ENCOUNTER — Ambulatory Visit: Payer: BC Managed Care – PPO

## 2022-12-10 ENCOUNTER — Ambulatory Visit: Payer: BC Managed Care – PPO | Attending: Obstetrics and Gynecology

## 2022-12-10 DIAGNOSIS — O09523 Supervision of elderly multigravida, third trimester: Secondary | ICD-10-CM | POA: Insufficient documentation

## 2022-12-10 DIAGNOSIS — E669 Obesity, unspecified: Secondary | ICD-10-CM

## 2022-12-10 DIAGNOSIS — O281 Abnormal biochemical finding on antenatal screening of mother: Secondary | ICD-10-CM

## 2022-12-10 DIAGNOSIS — Z3A36 36 weeks gestation of pregnancy: Secondary | ICD-10-CM

## 2022-12-10 DIAGNOSIS — O10913 Unspecified pre-existing hypertension complicating pregnancy, third trimester: Secondary | ICD-10-CM | POA: Insufficient documentation

## 2022-12-10 DIAGNOSIS — O10013 Pre-existing essential hypertension complicating pregnancy, third trimester: Secondary | ICD-10-CM

## 2022-12-10 DIAGNOSIS — O99213 Obesity complicating pregnancy, third trimester: Secondary | ICD-10-CM

## 2022-12-10 DIAGNOSIS — O99513 Diseases of the respiratory system complicating pregnancy, third trimester: Secondary | ICD-10-CM | POA: Diagnosis not present

## 2022-12-10 DIAGNOSIS — J45909 Unspecified asthma, uncomplicated: Secondary | ICD-10-CM

## 2022-12-14 DIAGNOSIS — O09893 Supervision of other high risk pregnancies, third trimester: Secondary | ICD-10-CM | POA: Diagnosis not present

## 2022-12-14 DIAGNOSIS — O163 Unspecified maternal hypertension, third trimester: Secondary | ICD-10-CM | POA: Diagnosis not present

## 2022-12-17 ENCOUNTER — Ambulatory Visit: Payer: BC Managed Care – PPO

## 2022-12-17 ENCOUNTER — Ambulatory Visit: Payer: BC Managed Care – PPO | Attending: Obstetrics

## 2022-12-17 ENCOUNTER — Other Ambulatory Visit: Payer: Self-pay

## 2022-12-17 DIAGNOSIS — O10013 Pre-existing essential hypertension complicating pregnancy, third trimester: Secondary | ICD-10-CM

## 2022-12-17 DIAGNOSIS — O10913 Unspecified pre-existing hypertension complicating pregnancy, third trimester: Secondary | ICD-10-CM | POA: Insufficient documentation

## 2022-12-17 DIAGNOSIS — O09523 Supervision of elderly multigravida, third trimester: Secondary | ICD-10-CM | POA: Diagnosis not present

## 2022-12-17 DIAGNOSIS — Z3A37 37 weeks gestation of pregnancy: Secondary | ICD-10-CM

## 2022-12-17 DIAGNOSIS — O99213 Obesity complicating pregnancy, third trimester: Secondary | ICD-10-CM

## 2022-12-17 DIAGNOSIS — O99513 Diseases of the respiratory system complicating pregnancy, third trimester: Secondary | ICD-10-CM

## 2022-12-17 DIAGNOSIS — O281 Abnormal biochemical finding on antenatal screening of mother: Secondary | ICD-10-CM | POA: Diagnosis not present

## 2022-12-17 DIAGNOSIS — J45909 Unspecified asthma, uncomplicated: Secondary | ICD-10-CM

## 2022-12-17 DIAGNOSIS — E669 Obesity, unspecified: Secondary | ICD-10-CM

## 2022-12-23 DIAGNOSIS — O163 Unspecified maternal hypertension, third trimester: Secondary | ICD-10-CM | POA: Diagnosis not present

## 2022-12-23 DIAGNOSIS — O09893 Supervision of other high risk pregnancies, third trimester: Secondary | ICD-10-CM | POA: Diagnosis not present

## 2022-12-23 LAB — OB RESULTS CONSOLE GBS: GBS: POSITIVE

## 2022-12-24 ENCOUNTER — Encounter (HOSPITAL_COMMUNITY): Payer: Self-pay | Admitting: *Deleted

## 2022-12-24 ENCOUNTER — Telehealth (HOSPITAL_COMMUNITY): Payer: Self-pay | Admitting: *Deleted

## 2022-12-24 NOTE — Telephone Encounter (Signed)
Preadmission screen  

## 2022-12-25 ENCOUNTER — Inpatient Hospital Stay (HOSPITAL_COMMUNITY)
Admission: RE | Admit: 2022-12-25 | Discharge: 2022-12-29 | DRG: 805 | Disposition: A | Discharge status: Home / Self Care | Payer: BC Managed Care – PPO | Attending: Obstetrics and Gynecology | Admitting: Obstetrics and Gynecology

## 2022-12-25 ENCOUNTER — Inpatient Hospital Stay (HOSPITAL_COMMUNITY): Payer: BC Managed Care – PPO

## 2022-12-25 ENCOUNTER — Encounter (HOSPITAL_COMMUNITY): Payer: Self-pay | Admitting: Obstetrics and Gynecology

## 2022-12-25 ENCOUNTER — Other Ambulatory Visit: Payer: Self-pay

## 2022-12-25 DIAGNOSIS — O9902 Anemia complicating childbirth: Secondary | ICD-10-CM | POA: Diagnosis present

## 2022-12-25 DIAGNOSIS — Z8616 Personal history of COVID-19: Secondary | ICD-10-CM | POA: Diagnosis not present

## 2022-12-25 DIAGNOSIS — O411230 Chorioamnionitis, third trimester, not applicable or unspecified: Secondary | ICD-10-CM | POA: Diagnosis present

## 2022-12-25 DIAGNOSIS — O1092 Unspecified pre-existing hypertension complicating childbirth: Secondary | ICD-10-CM | POA: Diagnosis not present

## 2022-12-25 DIAGNOSIS — Z3A39 39 weeks gestation of pregnancy: Secondary | ICD-10-CM

## 2022-12-25 DIAGNOSIS — Z23 Encounter for immunization: Secondary | ICD-10-CM | POA: Diagnosis not present

## 2022-12-25 DIAGNOSIS — Z801 Family history of malignant neoplasm of trachea, bronchus and lung: Secondary | ICD-10-CM

## 2022-12-25 DIAGNOSIS — Z825 Family history of asthma and other chronic lower respiratory diseases: Secondary | ICD-10-CM | POA: Diagnosis not present

## 2022-12-25 DIAGNOSIS — O99214 Obesity complicating childbirth: Secondary | ICD-10-CM | POA: Diagnosis present

## 2022-12-25 DIAGNOSIS — O10919 Unspecified pre-existing hypertension complicating pregnancy, unspecified trimester: Principal | ICD-10-CM | POA: Diagnosis present

## 2022-12-25 DIAGNOSIS — Z8249 Family history of ischemic heart disease and other diseases of the circulatory system: Secondary | ICD-10-CM | POA: Diagnosis not present

## 2022-12-25 DIAGNOSIS — Z803 Family history of malignant neoplasm of breast: Secondary | ICD-10-CM

## 2022-12-25 DIAGNOSIS — Z823 Family history of stroke: Secondary | ICD-10-CM | POA: Diagnosis not present

## 2022-12-25 DIAGNOSIS — Z833 Family history of diabetes mellitus: Secondary | ICD-10-CM | POA: Diagnosis not present

## 2022-12-25 DIAGNOSIS — O99824 Streptococcus B carrier state complicating childbirth: Secondary | ICD-10-CM | POA: Diagnosis present

## 2022-12-25 LAB — CBC
HCT: 31.3 % — ABNORMAL LOW (ref 36.0–46.0)
Hemoglobin: 10 g/dL — ABNORMAL LOW (ref 12.0–15.0)
MCH: 25.3 pg — ABNORMAL LOW (ref 26.0–34.0)
MCHC: 31.9 g/dL (ref 30.0–36.0)
MCV: 79 fL — ABNORMAL LOW (ref 80.0–100.0)
Platelets: 283 10*3/uL (ref 150–400)
RBC: 3.96 MIL/uL (ref 3.87–5.11)
RDW: 16.4 % — ABNORMAL HIGH (ref 11.5–15.5)
WBC: 11.7 10*3/uL — ABNORMAL HIGH (ref 4.0–10.5)
nRBC: 0 % (ref 0.0–0.2)

## 2022-12-25 LAB — TYPE AND SCREEN
ABO/RH(D): B POS
Antibody Screen: NEGATIVE

## 2022-12-25 LAB — RPR: RPR Ser Ql: NONREACTIVE

## 2022-12-25 MED ORDER — SODIUM CHLORIDE 0.9 % IV SOLN
5.0000 10*6.[IU] | Freq: Once | INTRAVENOUS | Status: AC
  Administered 2022-12-25: 5 10*6.[IU] via INTRAVENOUS
  Filled 2022-12-25: qty 5

## 2022-12-25 MED ORDER — TERBUTALINE SULFATE 1 MG/ML IJ SOLN: 0.2500 mg | Freq: Once | INTRAMUSCULAR | Status: DC | PRN

## 2022-12-25 MED ORDER — OXYCODONE-ACETAMINOPHEN 5-325 MG PO TABS: 1.0000 | ORAL_TABLET | ORAL | Status: DC | PRN

## 2022-12-25 MED ORDER — OXYTOCIN BOLUS FROM INFUSION
333.0000 mL | Freq: Once | INTRAVENOUS | Status: AC
  Administered 2022-12-27: 333 mL via INTRAVENOUS

## 2022-12-25 MED ORDER — LACTULOSE ENEMA: 300.0000 mL | Freq: Once | ORAL | Status: DC

## 2022-12-25 MED ORDER — ONDANSETRON HCL 4 MG/2ML IJ SOLN
4.0000 mg | Freq: Four times a day (QID) | INTRAMUSCULAR | Status: DC | PRN
Start: 2022-12-25 — End: 2022-12-28
  Administered 2022-12-27: 4 mg via INTRAVENOUS
  Filled 2022-12-25: qty 2

## 2022-12-25 MED ORDER — LACTATED RINGERS IV SOLN: 500.0000 mL | INTRAVENOUS | Status: AC | PRN

## 2022-12-25 MED ORDER — OXYTOCIN-SODIUM CHLORIDE 30-0.9 UT/500ML-% IV SOLN
1.0000 m[IU]/min | INTRAVENOUS | Status: DC
  Administered 2022-12-25: 2 m[IU]/min via INTRAVENOUS
  Administered 2022-12-26: 8 m[IU]/min via INTRAVENOUS
  Administered 2022-12-27: 40 m[IU]/min via INTRAVENOUS
  Filled 2022-12-25 (×4): qty 500

## 2022-12-25 MED ORDER — SERTRALINE HCL 50 MG PO TABS
50.0000 mg | ORAL_TABLET | Freq: Every day | ORAL | Status: DC
  Administered 2022-12-25 – 2022-12-27 (×3): 50 mg via ORAL
  Filled 2022-12-25 (×4): qty 1

## 2022-12-25 MED ORDER — FLEET ENEMA RE ENEM
1.0000 | ENEMA | Freq: Once | RECTAL | Status: AC
  Administered 2022-12-25: 1 via RECTAL
  Filled 2022-12-25 (×2): qty 1

## 2022-12-25 MED ORDER — NIFEDIPINE ER OSMOTIC RELEASE 30 MG PO TB24
60.0000 mg | ORAL_TABLET | Freq: Every day | ORAL | Status: DC
  Administered 2022-12-25: 60 mg via ORAL

## 2022-12-25 MED ORDER — LACTATED RINGERS IV SOLN: INTRAVENOUS | Status: AC

## 2022-12-25 MED ORDER — ACETAMINOPHEN 325 MG PO TABS
650.0000 mg | ORAL_TABLET | ORAL | Status: DC | PRN
Start: 2022-12-25 — End: 2022-12-28
  Administered 2022-12-27: 650 mg via ORAL
  Filled 2022-12-25: qty 2

## 2022-12-25 MED ORDER — OXYTOCIN-SODIUM CHLORIDE 30-0.9 UT/500ML-% IV SOLN
2.5000 [IU]/h | INTRAVENOUS | Status: DC
  Administered 2022-12-27: 2.5 [IU]/h via INTRAVENOUS

## 2022-12-25 MED ORDER — PENICILLIN G POT IN DEXTROSE 60000 UNIT/ML IV SOLN
3.0000 10*6.[IU] | INTRAVENOUS | Status: DC
  Administered 2022-12-25 – 2022-12-27 (×14): 3 10*6.[IU] via INTRAVENOUS
  Filled 2022-12-25 (×14): qty 50

## 2022-12-25 MED ORDER — OXYCODONE-ACETAMINOPHEN 5-325 MG PO TABS: 2.0000 | ORAL_TABLET | ORAL | Status: DC | PRN

## 2022-12-25 MED ORDER — SOD CITRATE-CITRIC ACID 500-334 MG/5ML PO SOLN
30.0000 mL | ORAL | Status: DC | PRN
Start: 2022-12-25 — End: 2022-12-28

## 2022-12-25 MED ORDER — LIDOCAINE HCL (PF) 1 % IJ SOLN: 30.0000 mL | INTRAMUSCULAR | Status: DC | PRN

## 2022-12-25 MED ORDER — OXYTOCIN 10 UNIT/ML IJ SOLN: 10.0000 [IU] | Freq: Once | INTRAMUSCULAR | Status: DC

## 2022-12-25 NOTE — Progress Notes (Signed)
Kaitlyn Choi is a 42 y.o. G4P0030 at [redacted]w[redacted]d by LMP admitted for induction of labor due to Hypertension.  Subjective: Denies ctx  Objective: BP (!) 115/54   Pulse (!) 102   Temp 98.1 F (36.7 C) (Oral)   Resp 18   Ht 5\' 7"  (1.702 m)   Wt 109.3 kg   LMP 03/27/2022   BMI 37.75 kg/m  No intake/output data recorded. Total I/O In: 400 [P.O.:400] Out: -   FHT:  FHR: 145 bpm, variability: moderate,  accelerations:  Present,  decelerations:  Absent UC:   irregular, every 3-4 mins minutes SVE:   4 cm dilated, 60% effaced, -3 station After balloon out Tracing: cat 1  Labs: Lab Results  Component Value Date   WBC 11.7 (H) 12/25/2022   HGB 10.0 (L) 12/25/2022   HCT 31.3 (L) 12/25/2022   MCV 79.0 (L) 12/25/2022   PLT 283 12/25/2022    Assessment / Plan: Chronic HTN on procardia GBS cx positive on IV PCN Term gestation  AMA  Obesity P) continue pitocin. Continue with procardia   Anticipated MOD:  NSVD  Kaitlyn Choi 12/25/2022, 4:59 PM

## 2022-12-25 NOTE — H&P (Signed)
Kaitlyn Choi is a 42 y.o. female presenting for IOL @ 39 wk due to chronic HTN and AMA>40. OB History     Gravida  4   Para  0   Term  0   Preterm  0   AB  3   Living  0      SAB  1   IAB  2   Ectopic  0   Multiple  0   Live Births  0          Past Medical History:  Diagnosis Date   Abnormal urinalysis 12/17/2020   Allergic conjunctivitis, left eye 09/04/2021   Annual physical exam 04/16/2021   Anxiety    Anxiety and depression 12/17/2020   Asthma    Asthma action plan declined 12/17/1983   Chronic right-sided low back pain without sciatica 12/17/2020   Constipation 09/04/2021   COVID 03/12/2021   Depression    Flank pain 12/17/2020   Hypertension    Insomnia    Right upper quadrant pain 04/16/2021   Past Surgical History:  Procedure Laterality Date   ANKLE FRACTURE SURGERY Right 12/2015   Family History: family history includes Asthma in her paternal grandmother; Breast cancer in her paternal grandmother; Cancer in her maternal aunt, maternal grandmother, paternal grandmother, and paternal uncle; Diabetes in her paternal grandmother; Heart disease in her maternal grandmother; Hypertension in her father, maternal grandmother, and paternal grandmother; Kidney failure in her paternal grandmother; Lung cancer in her maternal grandmother; Stroke in her father, maternal uncle, and paternal grandfather; Sudden Cardiac Death in her maternal grandfather. Social History:  reports that she has never smoked. She has never used smokeless tobacco. She reports that she does not currently use alcohol after a past usage of about 5.0 standard drinks of alcohol per week. She reports that she does not use drugs.     Maternal Diabetes: No Genetic Screening: Normal Maternal Ultrasounds/Referrals: Normal Fetal Ultrasounds or other Referrals:  Referred to Materal Fetal Medicine  Maternal Substance Abuse:  No Significant Maternal Medications:  Meds include: Other:  Significant  Maternal Lab Results:  Group B Strep positive Number of Prenatal Visits:greater than 3 verified prenatal visits Maternal Vaccinations:RSV: Given during pregnancy >/=14 days ago, TDap, and Flu Other Comments:   chronic HTN, AMA. Abnl quad screen but recalculated normal  Review of Systems  All other systems reviewed and are negative.  History Dilation: Fingertip Effacement (%): Thick Station: -3 Exam by:: Ardeen Jourdain, RN Blood pressure (!) 108/57, pulse 79, temperature 97.9 F (36.6 C), temperature source Oral, resp. rate 18, height 5\' 7"  (1.702 m), weight 109.3 kg, last menstrual period 03/27/2022. Exam Physical Exam Constitutional:      Appearance: Normal appearance. She is obese.  HENT:     Head: Atraumatic.  Cardiovascular:     Rate and Rhythm: Regular rhythm.     Heart sounds: Normal heart sounds.  Pulmonary:     Breath sounds: Normal breath sounds.  Abdominal:     Comments: gravid  Genitourinary:    Comments: Ft/50/-3 Intracervical balloon placed Musculoskeletal:        General: Normal range of motion.  Skin:    General: Skin is warm and dry.  Neurological:     General: No focal deficit present.     Mental Status: She is alert and oriented to person, place, and time.  Psychiatric:        Mood and Affect: Mood normal.        Behavior: Behavior normal.  Prenatal labs: ABO, Rh: --/--/B POS (10/26 0736) Antibody: NEG (10/26 0736) Rubella: Immune (04/08 0000) RPR: Nonreactive (04/08 0000)  HBsAg: Negative (04/08 0000)  HIV: Non-reactive (04/08 0000)  GBS: Positive/-- (10/24 0000)   Assessment/Plan: Chronic HTN on procardia GBS cx positive AMA  Term gestation Obesity P) admit routine labs. IV Pitocin. IV pCN. Continue procardia   Tiombe Tomeo A Mariamawit Depaoli 12/25/2022, 9:51 AM

## 2022-12-26 MED ORDER — CALCIUM CARBONATE ANTACID 500 MG PO CHEW
400.0000 mg | CHEWABLE_TABLET | Freq: Once | ORAL | Status: AC
  Administered 2022-12-26: 400 mg via ORAL
  Filled 2022-12-26: qty 2

## 2022-12-26 MED ORDER — FAMOTIDINE IN NACL 20-0.9 MG/50ML-% IV SOLN
20.0000 mg | Freq: Two times a day (BID) | INTRAVENOUS | Status: DC
  Administered 2022-12-26 – 2022-12-27 (×3): 20 mg via INTRAVENOUS
  Filled 2022-12-26 (×4): qty 50

## 2022-12-26 MED ORDER — LACTATED RINGERS IV SOLN: INTRAVENOUS | Status: AC

## 2022-12-26 NOTE — Progress Notes (Signed)
Lilygrace Pfingston is a 42 y.o. G4P0030 at [redacted]w[redacted]d by LMP admitted for induction of labor due to Hypertension.  Subjective: No complaint. Denies ctx  Objective: BP (!) 116/54 (BP Location: Right Arm)   Pulse 70   Temp 97.6 F (36.4 C) (Oral)   Resp 17   Ht 5\' 7"  (1.702 m)   Wt 109.3 kg   LMP 03/27/2022   BMI 37.75 kg/m  I/O last 3 completed shifts: In: 400 [P.O.:400] Out: -  Total I/O In: 297.4 [I.V.:297.4] Out: -   FHT:  FHR: 145 bpm, variability: moderate,  accelerations:  Present,  decelerations:  Absent UC:   ? Q 2 mins SVE:   4 cm dilated, 50% effaced, -3 applied station/ AROM clear fluid. IUPC placed Tracing: cat 1  Labs: Lab Results  Component Value Date   WBC 11.7 (H) 12/25/2022   HGB 10.0 (L) 12/25/2022   HCT 31.3 (L) 12/25/2022   MCV 79.0 (L) 12/25/2022   PLT 283 12/25/2022    Assessment / Plan: Protracted latent phase Chronic HTN  AMA GBS cx on IV PCN P) hold BP med if still nl. Continue Pitocin.  Continue IV PCN   Anticipated MOD:  NSVD  Beatriz Quintela A Marquail Bradwell 12/26/2022, 6:59 AM

## 2022-12-26 NOTE — Progress Notes (Signed)
S: no complaint Good FM    O: BP 114/63   Pulse 75   Temp 97.9 F (36.6 C) (Oral)   Resp 20   Ht 5\' 7"  (1.702 m)   Wt 109.3 kg   LMP 03/27/2022   BMI 37.75 kg/m   Pitocin 25 mu VE deferred Tracing: cat 1 Baseline 145 (+) accel Ctx q 3-6 mins  IMP: latent phase GBS cx (+)on IV PCN Chronic HTN off med AMA P)  will stop pitocin for one hour. Circuit of movements. Calcium 1000 mg  po x 1. Resume pitocin 8 miu

## 2022-12-26 NOTE — Progress Notes (Signed)
Lunges

## 2022-12-26 NOTE — Progress Notes (Signed)
Kaitlyn Choi is a 42 y.o. G4P0030 at [redacted]w[redacted]d by LMP admitted for induction of labor due to Hypertension.  Subjective: Notes cramping  Objective: BP (!) 127/57 (BP Location: Right Arm)   Pulse 83   Temp 98.1 F (36.7 C) (Oral)   Resp 17   Ht 5\' 7"  (1.702 m)   Wt 109.3 kg   LMP 03/27/2022   SpO2 100%   BMI 37.75 kg/m  I/O last 3 completed shifts: In: 3646.7 [P.O.:2360; I.V.:586.7; IV Piggyback:700] Out: 1575 [Urine:1575] No intake/output data recorded. Pitocin 26 miu FHT:  FHR: 150 bpm, variability: moderate,  accelerations:  Abscent,  decelerations:  Absent UC:   ctx q 2-4 mins SVE:   deferred Tracing: cat 1  Labs: Lab Results  Component Value Date   WBC 11.7 (H) 12/25/2022   HGB 10.0 (L) 12/25/2022   HCT 31.3 (L) 12/25/2022   MCV 79.0 (L) 12/25/2022   PLT 283 12/25/2022    Assessment / Plan: Protracted latent phase Chronic HTN no med GBS cx positive on IV PCN AROM  AMA P)continue pitocin. Hold nifedipine given BP. Defer pelvic exam until adequate labor or uncomfortable   Anticipated MOD:  NSVD  Kaitlyn Choi 12/26/2022, 10:03 PM

## 2022-12-26 NOTE — Progress Notes (Signed)
Large adult cuff

## 2022-12-26 NOTE — Progress Notes (Signed)
Pt up to bathroom between position changes

## 2022-12-26 NOTE — Progress Notes (Signed)
Pt remains in bathroom.  Pt told that the baby has been off the monitor for a long time.  She replied that she is still using the bathroom.  I requested that pt finish up so monitor can be adusted

## 2022-12-26 NOTE — Progress Notes (Signed)
Regular adult cuff

## 2022-12-27 ENCOUNTER — Encounter (HOSPITAL_COMMUNITY): Payer: Self-pay | Admitting: Obstetrics and Gynecology

## 2022-12-27 ENCOUNTER — Inpatient Hospital Stay (HOSPITAL_COMMUNITY): Payer: BC Managed Care – PPO | Admitting: Anesthesiology

## 2022-12-27 LAB — CBC WITH DIFFERENTIAL/PLATELET
Abs Immature Granulocytes: 0.04 10*3/uL (ref 0.00–0.07)
Basophils Absolute: 0 10*3/uL (ref 0.0–0.1)
Basophils Relative: 0 %
Eosinophils Absolute: 0 10*3/uL (ref 0.0–0.5)
Eosinophils Relative: 0 %
HCT: 32.4 % — ABNORMAL LOW (ref 36.0–46.0)
Hemoglobin: 10.3 g/dL — ABNORMAL LOW (ref 12.0–15.0)
Immature Granulocytes: 0 %
Lymphocytes Relative: 15 %
Lymphs Abs: 1.8 10*3/uL (ref 0.7–4.0)
MCH: 25.3 pg — ABNORMAL LOW (ref 26.0–34.0)
MCHC: 31.8 g/dL (ref 30.0–36.0)
MCV: 79.6 fL — ABNORMAL LOW (ref 80.0–100.0)
Monocytes Absolute: 0.8 10*3/uL (ref 0.1–1.0)
Monocytes Relative: 7 %
Neutro Abs: 9 10*3/uL — ABNORMAL HIGH (ref 1.7–7.7)
Neutrophils Relative %: 78 %
Platelets: 276 10*3/uL (ref 150–400)
RBC: 4.07 MIL/uL (ref 3.87–5.11)
RDW: 16.6 % — ABNORMAL HIGH (ref 11.5–15.5)
WBC: 11.7 10*3/uL — ABNORMAL HIGH (ref 4.0–10.5)
nRBC: 0 % (ref 0.0–0.2)

## 2022-12-27 MED ORDER — LACTATED RINGERS IV BOLUS
500.0000 mL | Freq: Once | INTRAVENOUS | Status: AC
  Administered 2022-12-27: 500 mL via INTRAVENOUS

## 2022-12-27 MED ORDER — FENTANYL-BUPIVACAINE-NACL 0.5-0.125-0.9 MG/250ML-% EP SOLN
12.0000 mL/h | EPIDURAL | Status: DC | PRN
  Administered 2022-12-27 (×2): 12 mL/h via EPIDURAL
  Filled 2022-12-27 (×2): qty 250

## 2022-12-27 MED ORDER — PHENYLEPHRINE 80 MCG/ML (10ML) SYRINGE FOR IV PUSH (FOR BLOOD PRESSURE SUPPORT)
80.0000 ug | PREFILLED_SYRINGE | INTRAVENOUS | Status: DC | PRN
  Filled 2022-12-27: qty 10

## 2022-12-27 MED ORDER — SODIUM CHLORIDE 0.9 % IV SOLN
2.0000 g | Freq: Four times a day (QID) | INTRAVENOUS | Status: DC
  Administered 2022-12-27 – 2022-12-28 (×3): 2 g via INTRAVENOUS
  Filled 2022-12-27 (×3): qty 2000

## 2022-12-27 MED ORDER — ACETAMINOPHEN 500 MG PO TABS
1000.0000 mg | ORAL_TABLET | Freq: Once | ORAL | Status: AC
  Administered 2022-12-27: 1000 mg via ORAL
  Filled 2022-12-27: qty 2

## 2022-12-27 MED ORDER — DIPHENHYDRAMINE HCL 50 MG/ML IJ SOLN: 12.5000 mg | INTRAMUSCULAR | Status: DC | PRN

## 2022-12-27 MED ORDER — MISOPROSTOL 200 MCG PO TABS
ORAL_TABLET | ORAL | Status: AC
  Filled 2022-12-27: qty 5

## 2022-12-27 MED ORDER — GENTAMICIN SULFATE 40 MG/ML IJ SOLN
5.0000 mg/kg | INTRAVENOUS | Status: DC
  Administered 2022-12-27: 400 mg via INTRAVENOUS
  Filled 2022-12-27 (×2): qty 10

## 2022-12-27 MED ORDER — PHENYLEPHRINE 80 MCG/ML (10ML) SYRINGE FOR IV PUSH (FOR BLOOD PRESSURE SUPPORT): 80.0000 ug | PREFILLED_SYRINGE | INTRAVENOUS | Status: DC | PRN

## 2022-12-27 MED ORDER — EPHEDRINE 5 MG/ML INJ: 10.0000 mg | INTRAVENOUS | Status: DC | PRN

## 2022-12-27 MED ORDER — LACTATED RINGERS IV SOLN
500.0000 mL | Freq: Once | INTRAVENOUS | Status: AC
  Administered 2022-12-27: 500 mL via INTRAVENOUS

## 2022-12-27 MED ORDER — TRANEXAMIC ACID-NACL 1000-0.7 MG/100ML-% IV SOLN
INTRAVENOUS | Status: AC
  Administered 2022-12-27: 1000 mg
  Filled 2022-12-27: qty 100

## 2022-12-27 MED ORDER — FENTANYL CITRATE (PF) 100 MCG/2ML IJ SOLN
100.0000 ug | INTRAMUSCULAR | Status: DC | PRN
  Administered 2022-12-27: 100 ug via INTRAVENOUS
  Filled 2022-12-27: qty 2

## 2022-12-27 MED ORDER — LIDOCAINE HCL (PF) 1 % IJ SOLN
INTRAMUSCULAR | Status: DC | PRN
  Administered 2022-12-27: 5 mL via EPIDURAL
  Administered 2022-12-27: 4 mL via EPIDURAL

## 2022-12-27 NOTE — Progress Notes (Signed)
Kaitlyn Choi is Kaitlyn 42 y.o. G4P0030 at [redacted]w[redacted]d by LMP admitted for induction of labor due to Hypertension.  Subjective: Epidural  Objective: BP 103/67   Pulse 74   Temp 98 F (36.7 C) (Oral)   Resp 20   Ht 5\' 7"  (1.702 m)   Wt 109.3 kg   LMP 03/27/2022   SpO2 100%   BMI 37.75 kg/m  I/O last 3 completed shifts: In: 3246.7 [P.O.:1960; I.V.:586.7; IV Piggyback:700] Out: 2075 [Urine:2075] No intake/output data recorded.  FHT:  FHR: 145 bpm, variability: moderate,  accelerations:  Present,  decelerations:  Absent UC:   regular, every 3 minutes SVE:   5 cm dilated, 90% effaced, -2 station Tracing: cat 1  Labs: Lab Results  Component Value Date   WBC 11.7 (H) 12/27/2022   HGB 10.3 (L) 12/27/2022   HCT 32.4 (L) 12/27/2022   MCV 79.6 (L) 12/27/2022   PLT 276 12/27/2022    Assessment / Plan: Protracted latent phase GBS cx (+) IV PCN Chronic HTN off med due to low BP AMA P) continue IV PCN  Anticipated MOD:   guarded  Kaitlyn Choi Kaitlyn Choi 12/27/2022, 8:15 AM

## 2022-12-27 NOTE — Progress Notes (Signed)
S: sleeping  O:BP 135/72   Pulse 68   Temp 98 F (36.7 C) (Oral)   Resp 20   Ht 5\' 7"  (1.702 m)   Wt 109.3 kg   LMP 03/27/2022   SpO2 100%   BMI 37.75 kg/m   Pitocin 16 miu VE 6/100/-1  Tracing: baseline 150 (+) accel  Ctx q 2-3 mins  IMP; active phase( protracted) Chronic HTN off med  AMA GBS cx (+) on IV PCN P) continue with pitocin/PCN

## 2022-12-27 NOTE — Progress Notes (Signed)
Peanut removed.  Variable resolving.  Peanut replaced with peanut

## 2022-12-27 NOTE — Anesthesia Preprocedure Evaluation (Signed)
Anesthesia Evaluation  Patient identified by MRN, date of birth, ID band Patient awake    Reviewed: Allergy & Precautions, NPO status , Patient's Chart, lab work & pertinent test results  History of Anesthesia Complications Negative for: history of anesthetic complications  Airway Mallampati: II   Neck ROM: Full    Dental   Pulmonary asthma    Pulmonary exam normal        Cardiovascular hypertension, Pt. on medications Normal cardiovascular exam     Neuro/Psych  PSYCHIATRIC DISORDERS Anxiety Depression    negative neurological ROS     GI/Hepatic negative GI ROS, Neg liver ROS,,,  Endo/Other   Obesity   Renal/GU negative Renal ROS     Musculoskeletal negative musculoskeletal ROS (+)    Abdominal   Peds  Hematology  (+) Blood dyscrasia, anemia  Plt 276k    Anesthesia Other Findings   Reproductive/Obstetrics (+) Pregnancy                              Anesthesia Physical Anesthesia Plan  ASA: 2  Anesthesia Plan: Epidural   Post-op Pain Management: Minimal or no pain anticipated   Induction:   PONV Risk Score and Plan: 2 and Treatment may vary due to age or medical condition  Airway Management Planned: Natural Airway  Additional Equipment: None  Intra-op Plan:   Post-operative Plan:   Informed Consent: I have reviewed the patients History and Physical, chart, labs and discussed the procedure including the risks, benefits and alternatives for the proposed anesthesia with the patient or authorized representative who has indicated his/her understanding and acceptance.       Plan Discussed with: Anesthesiologist  Anesthesia Plan Comments: (Labs reviewed. Platelets acceptable, patient not taking any blood thinning medications. Per RN, FHR tracing reported to be stable enough for sitting procedure. Risks and benefits discussed with patient, including PDPH, backache, epidural  hematoma, failed epidural, blood pressure changes, allergic reaction, and nerve injury. Patient expressed understanding and wished to proceed.)         Anesthesia Quick Evaluation

## 2022-12-27 NOTE — Progress Notes (Signed)
Pharmacy Antibiotic Note  Kaitlyn Choi is a 42 y.o. female admitted on 12/25/2022 for IOL due to hypertension.  Pharmacy has been consulted for Gentamicin dosing for chorioamnionitis/Triple I.   Plan: Gentamicin 400mg  (5mg /kg) IV q24h using Adjusted/Dosing weight : 80.7kg  Height: 5\' 7"  (170.2 cm) Weight: 109.3 kg (241 lb) IBW/kg (Calculated) : 61.6  Temp (24hrs), Avg:98.3 F (36.8 C), Min:97.5 F (36.4 C), Max:99 F (37.2 C)  Recent Labs  Lab 12/25/22 0735 12/27/22 0058  WBC 11.7* 11.7*    CrCl cannot be calculated (Patient's most recent lab result is older than the maximum 21 days allowed.).    Allergies  Allergen Reactions   Latex Rash    Antimicrobials this admission: Penicillin protocol for GBS 10/26-10/28    Thank you for allowing pharmacy to be a part of this patient's care.  Claybon Jabs 12/27/2022 6:03 PM

## 2022-12-27 NOTE — Progress Notes (Signed)
Pt propped up eating soup.  U/s tracing maternal

## 2022-12-27 NOTE — Progress Notes (Signed)
Kaitlyn Choi is a 42 y.o. G4P0030 at [redacted]w[redacted]d by LMP admitted for induction of labor due to Hypertension.  Subjective: C/o rectal pressure Pitocin 24 miu  Objective: BP 120/67   Pulse 78   Temp 98.4 F (36.9 C) (Oral)   Resp 19   Ht 5\' 7"  (1.702 m)   Wt 109.3 kg   LMP 03/27/2022   SpO2 100%   BMI 37.75 kg/m  I/O last 3 completed shifts: In: 4161.4 [P.O.:2160; I.V.:1101.4; IV Piggyback:900] Out: 2250 [Urine:2250] Total I/O In: 2257.1 [P.O.:660; I.V.:1211.7; Other:85.4; IV Piggyback:300] Out: 1300 [Urine:1300]  FHT:  FHR: 150 bpm, variability: moderate,  accelerations:  Present,  decelerations:  Present small variables UC:   irregular, every 2-3 minutes SVE:   rim cm dilated, 100% effaced, +1 station Tracing: cat 1  Labs: Lab Results  Component Value Date   WBC 11.7 (H) 12/27/2022   HGB 10.3 (L) 12/27/2022   HCT 32.4 (L) 12/27/2022   MCV 79.6 (L) 12/27/2022   PLT 276 12/27/2022    Assessment / Plan: Protracted active phase Presumed chorio due to prolonged ROM (+) GBS cx (+) on IV PCN Chronic HTN w/o med. On hold due to lwo BP AMA P) continue pitocin. IV Amp/Gent. IVF bolus( 500 ml). Tylenol 1000 mg x 1    Anticipated MOD:  NSVD  Kaitlyn Choi 12/27/2022, 6:03 PM

## 2022-12-27 NOTE — Anesthesia Procedure Notes (Signed)
Epidural Patient location during procedure: OB Start time: 12/27/2022 2:17 AM End time: 12/27/2022 2:20 AM  Staffing Anesthesiologist: Beryle Lathe, MD Performed: anesthesiologist   Preanesthetic Checklist Completed: patient identified, IV checked, risks and benefits discussed, monitors and equipment checked, pre-op evaluation and timeout performed  Epidural Patient position: sitting Prep: DuraPrep Patient monitoring: continuous pulse ox and blood pressure Approach: midline Location: L2-L3 Injection technique: LOR saline  Needle:  Needle type: Tuohy  Needle gauge: 17 G Needle length: 9 cm Needle insertion depth: 9 cm Catheter size: 19 Gauge Catheter at skin depth: 14 cm Test dose: negative and Other (1% lidocaine)  Assessment Events: blood not aspirated and no cerebrospinal fluid  Additional Notes Patient identified. Risks including, but not limited to, bleeding, infection, nerve damage, paralysis, inadequate analgesia, blood pressure changes, nausea, vomiting, allergic reaction, postpartum back pain, itching, and headache were discussed. Patient expressed understanding and wished to proceed. Sterile prep and drape, including hand hygiene, mask, and sterile gloves were used. The patient was positioned and the spine was prepped. The skin was anesthetized with lidocaine. No paraesthesia or other complication noted. The patient did not experience any signs of intravascular injection such as tinnitus or metallic taste in mouth, nor signs of intrathecal spread such as rapid motor block. Please see nursing notes for vital signs. The patient tolerated the procedure well.   Leslye Peer, MDReason for block:procedure for pain

## 2022-12-28 LAB — CBC
HCT: 29.9 % — ABNORMAL LOW (ref 36.0–46.0)
HCT: 31.5 % — ABNORMAL LOW (ref 36.0–46.0)
Hemoglobin: 10.3 g/dL — ABNORMAL LOW (ref 12.0–15.0)
Hemoglobin: 9.5 g/dL — ABNORMAL LOW (ref 12.0–15.0)
MCH: 25.6 pg — ABNORMAL LOW (ref 26.0–34.0)
MCH: 26.4 pg (ref 26.0–34.0)
MCHC: 31.8 g/dL (ref 30.0–36.0)
MCHC: 32.7 g/dL (ref 30.0–36.0)
MCV: 80.6 fL (ref 80.0–100.0)
MCV: 80.8 fL (ref 80.0–100.0)
Platelets: 249 10*3/uL (ref 150–400)
Platelets: 259 10*3/uL (ref 150–400)
RBC: 3.71 MIL/uL — ABNORMAL LOW (ref 3.87–5.11)
RBC: 3.9 MIL/uL (ref 3.87–5.11)
RDW: 16.9 % — ABNORMAL HIGH (ref 11.5–15.5)
RDW: 17 % — ABNORMAL HIGH (ref 11.5–15.5)
WBC: 23.4 10*3/uL — ABNORMAL HIGH (ref 4.0–10.5)
WBC: 23.9 10*3/uL — ABNORMAL HIGH (ref 4.0–10.5)
nRBC: 0 % (ref 0.0–0.2)
nRBC: 0 % (ref 0.0–0.2)

## 2022-12-28 MED ORDER — PRENATAL MULTIVITAMIN CH
1.0000 | ORAL_TABLET | Freq: Every day | ORAL | Status: DC
  Administered 2022-12-28 – 2022-12-29 (×2): 1 via ORAL
  Filled 2022-12-28 (×2): qty 1

## 2022-12-28 MED ORDER — FERROUS SULFATE 325 (65 FE) MG PO TABS
325.0000 mg | ORAL_TABLET | Freq: Two times a day (BID) | ORAL | Status: DC
  Administered 2022-12-28 – 2022-12-29 (×3): 325 mg via ORAL
  Filled 2022-12-28 (×3): qty 1

## 2022-12-28 MED ORDER — WITCH HAZEL-GLYCERIN EX PADS: 1.0000 | MEDICATED_PAD | CUTANEOUS | Status: DC | PRN

## 2022-12-28 MED ORDER — OXYTOCIN-SODIUM CHLORIDE 30-0.9 UT/500ML-% IV SOLN
2.5000 [IU]/h | INTRAVENOUS | Status: DC | PRN
Start: 2022-12-28 — End: 2022-12-29

## 2022-12-28 MED ORDER — NIFEDIPINE ER OSMOTIC RELEASE 30 MG PO TB24
30.0000 mg | ORAL_TABLET | Freq: Every day | ORAL | Status: DC
  Administered 2022-12-28 – 2022-12-29 (×2): 30 mg via ORAL
  Filled 2022-12-28 (×2): qty 1

## 2022-12-28 MED ORDER — ONDANSETRON HCL 4 MG/2ML IJ SOLN: 4.0000 mg | INTRAMUSCULAR | Status: DC | PRN

## 2022-12-28 MED ORDER — SERTRALINE HCL 50 MG PO TABS
50.0000 mg | ORAL_TABLET | Freq: Every day | ORAL | Status: DC
  Administered 2022-12-28 – 2022-12-29 (×2): 50 mg via ORAL
  Filled 2022-12-28 (×2): qty 1

## 2022-12-28 MED ORDER — PNEUMOCOCCAL 20-VAL CONJ VACC 0.5 ML IM SUSY
0.5000 mL | PREFILLED_SYRINGE | INTRAMUSCULAR | Status: AC
  Administered 2022-12-29: 0.5 mL via INTRAMUSCULAR
  Filled 2022-12-28: qty 0.5

## 2022-12-28 MED ORDER — COCONUT OIL OIL
1.0000 | TOPICAL_OIL | Status: DC | PRN
Start: 2022-12-28 — End: 2022-12-29

## 2022-12-28 MED ORDER — SENNOSIDES-DOCUSATE SODIUM 8.6-50 MG PO TABS
2.0000 | ORAL_TABLET | Freq: Every day | ORAL | Status: DC
  Administered 2022-12-28 – 2022-12-29 (×2): 2 via ORAL
  Filled 2022-12-28 (×2): qty 2

## 2022-12-28 MED ORDER — DIBUCAINE (PERIANAL) 1 % EX OINT: 1.0000 | TOPICAL_OINTMENT | CUTANEOUS | Status: DC | PRN

## 2022-12-28 MED ORDER — ONDANSETRON HCL 4 MG PO TABS: 4.0000 mg | ORAL_TABLET | ORAL | Status: DC | PRN

## 2022-12-28 MED ORDER — BENZOCAINE-MENTHOL 20-0.5 % EX AERO
1.0000 | INHALATION_SPRAY | CUTANEOUS | Status: DC | PRN
  Filled 2022-12-28: qty 56

## 2022-12-28 MED ORDER — ZOLPIDEM TARTRATE 5 MG PO TABS: 5.0000 mg | ORAL_TABLET | Freq: Every evening | ORAL | Status: DC | PRN

## 2022-12-28 MED ORDER — IBUPROFEN 600 MG PO TABS
600.0000 mg | ORAL_TABLET | Freq: Four times a day (QID) | ORAL | Status: DC
Start: 2022-12-28 — End: 2022-12-29
  Administered 2022-12-28 – 2022-12-29 (×6): 600 mg via ORAL
  Filled 2022-12-28 (×6): qty 1

## 2022-12-28 MED ORDER — ACETAMINOPHEN 325 MG PO TABS: 650.0000 mg | ORAL_TABLET | ORAL | Status: DC | PRN

## 2022-12-28 MED ORDER — DIPHENHYDRAMINE HCL 25 MG PO CAPS: 25.0000 mg | ORAL_CAPSULE | Freq: Four times a day (QID) | ORAL | Status: DC | PRN

## 2022-12-28 MED ORDER — SIMETHICONE 80 MG PO CHEW: 80.0000 mg | CHEWABLE_TABLET | ORAL | Status: DC | PRN

## 2022-12-28 NOTE — Lactation Note (Signed)
This note was copied from a baby's chart. Lactation Consultation Note  Patient Name: Kaitlyn Choi [id number] Date: 12/28/2022 Age:43 hours  Reason for consult: Follow-up assessment;Primapara;1st time breastfeeding;Term;Breastfeeding assistance;Difficult latch  P1, [redacted]w[redacted]d, 1% weight loss  Mother is very motivated to breastfeed. She requested assistance with latch and position. Baby has been sleepy and efforts have been by her nurse to help with latching. Baby received one bottle of DBM today.   Baby was alert and awake. Basic breastfeeding education with baby in football hold. Infant fed well for 10 minutes. DEBP was set up for mother to pre-pump and pump to stimulate milk production, especially if supplementation is being given.    Discussed the process of milk production, "supply and demand" and the importance of breast stimulation and milk removal in order to make an optimal milk supply.   Discussed mother to breastfeed 8-12 times in 24 hours, skin to skin and breast feed before supplement feeding of DBM/ formula. Mother to feed baby her EBM first.   If missed feedings at breast or substituting feeding with formula, advised to hand express and/or pump to remove milk from the breast.    Maternal Data Does the patient have breastfeeding experience prior to this delivery?: No  Feeding Mother's Current Feeding Choice: Breast Milk and Donor Milk Nipple Type: Slow - flow  LATCH Score Latch: Repeated attempts needed to sustain latch, nipple held in mouth throughout feeding, stimulation needed to elicit sucking reflex.  Audible Swallowing: A few with stimulation  Type of Nipple: Everted at rest and after stimulation  Comfort (Breast/Nipple): Soft / non-tender  Hold (Positioning): Assistance needed to correctly position infant at breast and maintain latch.  LATCH Score: 7   Lactation Tools Discussed/Used Tools: Pump;Flanges Flange Size: 24 Breast pump type: Double-Electric  Breast Pump Pump Education: Setup, frequency, and cleaning;Milk Storage Reason for Pumping: stimulate milk production, supplement, if needed Pumping frequency: pump if supplementing with DBM  Interventions Interventions: Breast feeding basics reviewed;Assisted with latch;Skin to skin;Hand express;Breast massage;Breast compression;Adjust position;Support pillows;DEBP;Education     Consult Status Consult Status: Follow-up Date: 12/29/22 Follow-up type: In-patient    Christella Hartigan M 12/28/2022, 6:44 PM

## 2022-12-28 NOTE — Lactation Note (Signed)
This note was copied from a baby's chart. Lactation Consultation Note  Patient Name: Kaitlyn Choi [id number] Date: 12/28/2022 Age:43 hours Reason for consult: Mother's request;Initial assessment;1st time breastfeeding;Term.  MOB did reverse pressure soften to help evert nipple outward prior to latch, MOB latched infant on her left breast  using the football hold position with  support pillows and infant BF for 15 minutes, afterwads infant was switched to the right breast and infant was still BF after 18 minutes when LC left the room. MOB will continue to BF infant by cues, on demand, every 2-3 hours, skin to skin. MOB knows to call for latch assistance if needed. LC discussed infant's input and output, importance of maternal rest, diet and hydration. MOB was made aware of O/P services, breastfeeding support groups, community resources, and our phone # for post-discharge questions.    Maternal Data Has patient been taught Hand Expression?: Yes  Feeding Mother's Current Feeding Choice: Breast Milk  LATCH Score Latch: Grasps breast easily, tongue down, lips flanged, rhythmical sucking.  Audible Swallowing: Spontaneous and intermittent  Type of Nipple: Flat (MOB did reverse pressure soften, nipples are compressible and infant latched without difficulty.)  Comfort (Breast/Nipple): Soft / non-tender  Hold (Positioning): Assistance needed to correctly position infant at breast and maintain latch.  LATCH Score: 8   Lactation Tools Discussed/Used    Interventions Interventions: Breast feeding basics reviewed;Assisted with latch;Skin to skin;Hand express;Breast compression;Adjust position;Support pillows;Position options;Reverse pressure;Education;LC Services brochure  Discharge Pump: DEBP;Personal  Consult Status Consult Status: Follow-up Date: 12/28/22 Follow-up type: In-patient    Frederico Hamman 12/28/2022, 2:03 AM

## 2022-12-28 NOTE — Social Work (Signed)
MOB was referred for history of depression/anxiety.   * Referral screened out by Clinical Social Worker because none of the following criteria appear to apply:  ~ History of anxiety/depression during this pregnancy, or of post-partum depression following prior delivery.  ~ Diagnosis of anxiety and/or depression within last 3 years OR * MOB's symptoms currently being treated with medication and/or therapy. Per chart review MOB is currently taking Zoloft.  CSW received and acknowledges consult for EDPS of 9.  Consult screened out due to 9 on EDPS does not warrant a CSW consult.  MOB whom scores are greater than 9/yes to question 10 on Edinburgh Postpartum Depression Screen warrants a CSW consult.   Please contact the Clinical Social Worker if needs arise, or by MOB request.  Wende Neighbors, LCSWA Clinical Social Worker [phone]

## 2022-12-28 NOTE — Anesthesia Postprocedure Evaluation (Signed)
Anesthesia Post Note  Patient: Kaitlyn Choi  Procedure(s) Performed: AN AD HOC LABOR EPIDURAL     Patient location during evaluation: Mother Baby Anesthesia Type: Epidural Level of consciousness: awake and alert and oriented Pain management: satisfactory to patient Vital Signs Assessment: post-procedure vital signs reviewed and stable Respiratory status: respiratory function stable Cardiovascular status: stable Postop Assessment: no headache, no backache, epidural receding, patient able to bend at knees, no signs of nausea or vomiting, adequate PO intake and able to ambulate Anesthetic complications: no   No notable events documented.  Last Vitals:  Vitals:   12/28/22 0600 12/28/22 1000  BP: (!) 101/58 117/70  Pulse: 62 75  Resp: 18 18  Temp: 36.7 C 36.8 C  SpO2: 95% 100%    Last Pain:  Vitals:   12/28/22 1000  TempSrc: Oral  PainSc:    Pain Goal: Patients Stated Pain Goal: 1 (12/27/22 0251)                 Karleen Dolphin

## 2022-12-28 NOTE — Plan of Care (Signed)
  Problem: Education: Goal: Knowledge of General Education information will improve Description: Including pain rating scale, medication(s)/side effects and non-pharmacologic comfort measures Outcome: Progressing   Problem: Health Behavior/Discharge Planning: Goal: Ability to manage health-related needs will improve Outcome: Progressing   Problem: Clinical Measurements: Goal: Ability to maintain clinical measurements within normal limits will improve Outcome: Progressing Goal: Will remain free from infection Outcome: Progressing Goal: Diagnostic test results will improve Outcome: Progressing Goal: Respiratory complications will improve Outcome: Progressing Goal: Cardiovascular complication will be avoided Outcome: Progressing   Problem: Activity: Goal: Risk for activity intolerance will decrease Outcome: Progressing   Problem: Nutrition: Goal: Adequate nutrition will be maintained Outcome: Progressing   Problem: Coping: Goal: Level of anxiety will decrease Outcome: Progressing   Problem: Elimination: Goal: Will not experience complications related to bowel motility Outcome: Progressing Goal: Will not experience complications related to urinary retention Outcome: Progressing   Problem: Pain Management: Goal: General experience of comfort will improve Outcome: Progressing   Problem: Safety: Goal: Ability to remain free from injury will improve Outcome: Progressing   Problem: Skin Integrity: Goal: Risk for impaired skin integrity will decrease Outcome: Progressing   Problem: Education: Goal: Knowledge of condition will improve Outcome: Progressing Goal: Individualized Educational Video(s) Outcome: Progressing Goal: Individualized Newborn Educational Video(s) Outcome: Progressing   Problem: Activity: Goal: Will verbalize the importance of balancing activity with adequate rest periods Outcome: Progressing Goal: Ability to tolerate increased activity will  improve Outcome: Progressing   Problem: Coping: Goal: Ability to identify and utilize available resources and services will improve Outcome: Progressing   Problem: Life Cycle: Goal: Chance of risk for complications during the postpartum period will decrease Outcome: Progressing   Problem: Role Relationship: Goal: Ability to demonstrate positive interaction with newborn will improve Outcome: Progressing   Problem: Skin Integrity: Goal: Demonstration of wound healing without infection will improve Outcome: Progressing

## 2022-12-28 NOTE — Progress Notes (Signed)
PPD{NUMBERS 1-5:20334} SVD:   S:  Pt reports feeling ***/ Tolerating po/ Voiding without problems/ No n/v/ Bleeding is {Description; bleeding vaginal:11356}/ Pain controlled with{treatments; pain control med:13496}  Newborn info ***   O:  A & O x 3 ***/ VS: Blood pressure (!) 101/58, pulse 62, temperature 98 F (36.7 C), temperature source Oral, resp. rate 18, height 5\' 7"  (1.702 m), weight 109.3 kg, last menstrual period 03/27/2022, SpO2 95%, unknown if currently breastfeeding.  LABS:  Results for orders placed or performed during the hospital encounter of 12/25/22 (from the past 24 hour(s))  CBC     Status: Abnormal   Collection Time: 12/28/22 12:01 AM  Result Value Ref Range   WBC 23.4 (H) 4.0 - 10.5 K/uL   RBC 3.90 3.87 - 5.11 MIL/uL   Hemoglobin 10.3 (L) 12.0 - 15.0 g/dL   HCT 16.1 (L) 09.6 - 04.5 %   MCV 80.8 80.0 - 100.0 fL   MCH 26.4 26.0 - 34.0 pg   MCHC 32.7 30.0 - 36.0 g/dL   RDW 40.9 (H) 81.1 - 91.4 %   Platelets 249 150 - 400 K/uL   nRBC 0.0 0.0 - 0.2 %  CBC     Status: Abnormal   Collection Time: 12/28/22  7:07 AM  Result Value Ref Range   WBC 23.9 (H) 4.0 - 10.5 K/uL   RBC 3.71 (L) 3.87 - 5.11 MIL/uL   Hemoglobin 9.5 (L) 12.0 - 15.0 g/dL   HCT 78.2 (L) 95.6 - 21.3 %   MCV 80.6 80.0 - 100.0 fL   MCH 25.6 (L) 26.0 - 34.0 pg   MCHC 31.8 30.0 - 36.0 g/dL   RDW 08.6 (H) 57.8 - 46.9 %   Platelets 259 150 - 400 K/uL   nRBC 0.0 0.0 - 0.2 %    I&O: I/O last 3 completed shifts: In: 4205.8 [P.O.:1660; I.V.:1840.4; Other:85.4; IV Piggyback:620] Out: 2230 [Urine:1930; Blood:300]   No intake/output data recorded.  Lungs: {Exam; lungs:5033}  Heart: {Exam; heart:5510}  Abdomen: {PE ABDOMEN POSTPARTUM OBGYN:313106}  Perineum: {exam; perineum:10172}  Lochia: ***  Extremities:{pe extremities GE:952841}    A/P: PPD # ***/ [id number]  Doing well  Continue routine post partum orders  ***

## 2022-12-29 ENCOUNTER — Ambulatory Visit (HOSPITAL_COMMUNITY): Payer: Self-pay

## 2022-12-29 LAB — SURGICAL PATHOLOGY

## 2022-12-29 MED ORDER — IBUPROFEN 600 MG PO TABS: 600.0000 mg | ORAL_TABLET | Freq: Four times a day (QID) | ORAL | 11 refills | Status: AC | PRN

## 2022-12-29 NOTE — Discharge Summary (Signed)
Postpartum Discharge Summary  Date of Service updated     Patient Name: Kaitlyn Choi DOB: 26-Nov-1980 MRN: [id number]  Date of admission: 12/25/2022 Delivery date:12/27/2022 Delivering provider: Anyelina Claycomb Date of discharge: 12/29/2022  Admitting diagnosis: Chronic hypertension in pregnancy [O10.919] Postpartum care following vaginal delivery [Z39.2] Intrauterine pregnancy: [redacted]w[redacted]d     Secondary diagnosis:  Principal Problem:   Chronic hypertension in pregnancy Active Problems:   Postpartum care following vaginal delivery  Additional problems: Group B streptococcus, AMA, obesity    Discharge diagnosis: Term Pregnancy Delivered, CHTN, and GBS cx positive, acute chorioamnionitis                                               Post partum procedures: n/a Augmentation: AROM Complications: Intrauterine Inflammation or infection (Chorioamniotis) and ROM>24 hours  Hospital course: Induction of Labor With Vaginal Delivery   42 y.o. yo [phone] at [redacted]w[redacted]d was admitted to the hospital 12/25/2022 for induction of labor.  Indication for induction:  chronic HTN, AMA .  Patient had an labor course complicated by protracted latent phase and acute chorioamnionitis treated with ampicillin and gentamicin Membrane Rupture Time/Date: 6:47 AM,12/26/2022  Delivery Method:Vaginal, Spontaneous Operative Delivery:N/A Episiotomy: None Lacerations:  2nd degree;Perineal Details of delivery can be found in separate delivery note.  Patient had a postpartum course complicated by none. Patient is discharged home 12/29/22.  Newborn Data: Birth date:12/27/2022 Birth time:10:36 PM Gender:Female Living status:Living Apgars:4 ,9  Weight:2.86 kg  Magnesium Sulfate received: No BMZ received: No Rhophylac:No MMR:No T-DaP:Given prenatally Flu: Yes RSV Vaccine received: Yes Transfusion:No Immunizations administered: Immunization History  Administered Date(s) Administered   Influenza-Unspecified  12/12/2020, 11/26/2022   Moderna Covid-19 Vaccine Bivalent Booster 39yrs & up 12/12/2020   PFIZER(Purple Top)SARS-COV-2 Vaccination 05/25/2019, 06/20/2019   Tdap 04/16/2021    Physical exam  Vitals:   12/28/22 1000 12/28/22 1400 12/28/22 2119 12/29/22 0606  BP: 117/70 112/74 111/66 127/71  Pulse: 75 70 73 62  Resp: 18 18 18 18   Temp: 98.2 F (36.8 C) 98.6 F (37 C) 98.3 F (36.8 C) 97.7 F (36.5 C)  TempSrc: Oral Oral Oral Oral  SpO2: 100% 100% 100% 98%  Weight:      Height:       General: alert, cooperative, and no distress Lochia: appropriate Uterine Fundus: firm Incision: N/A DVT Evaluation: No evidence of DVT seen on physical exam. No significant calf/ankle edema. Labs: Lab Results  Component Value Date   WBC 23.9 (H) 12/28/2022   HGB 9.5 (L) 12/28/2022   HCT 29.9 (L) 12/28/2022   MCV 80.6 12/28/2022   PLT 259 12/28/2022      Latest Ref Rng & Units 04/16/2021   10:35 AM  CMP  Glucose 70 - 99 mg/dL 093   BUN 6 - 24 mg/dL 9   Creatinine 2.35 - 5.73 mg/dL 2.20   Sodium 254 - 270 mmol/L 137   Potassium 3.5 - 5.2 mmol/L 4.2   Chloride 96 - 106 mmol/L 99   CO2 20 - 29 mmol/L 21   Calcium 8.7 - 10.2 mg/dL 9.0   Total Protein 6.0 - 8.5 g/dL 7.2   Total Bilirubin 0.0 - 1.2 mg/dL 1.0   Alkaline Phos 44 - 121 IU/L 61   AST 0 - 40 IU/L 37   ALT 0 - 32 IU/L 27    Edinburgh Score:  12/28/2022   10:00 AM  Edinburgh Postnatal Depression Scale Screening Tool  I have been able to laugh and see the funny side of things. 0  I have looked forward with enjoyment to things. 0  I have blamed myself unnecessarily when things went wrong. 1  I have been anxious or worried for no good reason. 2  I have felt scared or panicky for no good reason. 2  Things have been getting on top of me. 2  I have been so unhappy that I have had difficulty sleeping. 0  I have felt sad or miserable. 0  I have been so unhappy that I have been crying. 2  The thought of harming myself has  occurred to me. 0  Edinburgh Postnatal Depression Scale Total 9      After visit meds:  Allergies as of 12/29/2022       Reactions   Latex Rash        Medication List     TAKE these medications    ibuprofen 600 MG tablet Commonly known as: ADVIL Take 1 tablet (600 mg total) by mouth every 6 (six) hours as needed.   sertraline 50 MG tablet Commonly known as: ZOLOFT Take 50 mg by mouth daily.   vitamin C 250 MG tablet Commonly known as: ASCORBIC ACID Take 500 mg by mouth daily.         Discharge home in stable condition Infant Feeding: Breast Infant Disposition:rooming in Discharge instruction: per After Visit Summary and Postpartum booklet. Activity: Advance as tolerated. Pelvic rest for 6 weeks.  Diet: low salt diet Anticipated Birth Control: Condoms Postpartum Appointment:6 weeks Additional Postpartum F/U: BP check with cardiology Future Appointments: Future Appointments  Date Time Provider Department Center  03/18/2023 10:00 AM Chilton Si, MD [facility]   Follow up Visit:  Follow-up Information     Maxie Better, MD Follow up in 6 week(s).   Specialty: Obstetrics and Gynecology Contact information: [address] [place] [address] [phone]         Chilton Si, MD Follow up in 1 week(s).   Specialty: Cardiology Contact information: [address] [place] [address] [phone]                     12/29/2022 Serita Kyle, MD

## 2022-12-29 NOTE — Progress Notes (Signed)
PPD2 SVD:   S:  Pt reports feeling well/ Tolerating po/ Voiding without problems/ No n/v/ Bleeding is light/ Pain controlled withprescription NSAID's including motrin  Newborn info live female BRF   O:  A & O x 3 / VS: Blood pressure 127/71, pulse 62, temperature 97.7 F (36.5 C), temperature source Oral, resp. rate 18, height 5\' 7"  (1.702 m), weight 109.3 kg, last menstrual period 03/27/2022, SpO2 98%, unknown if currently breastfeeding.  LABS: No results found for this or any previous visit (from the past 24 hour(s)).  I&O: I/O last 3 completed shifts: In: 0  Out: 430 [Urine:130; Blood:300]   No intake/output data recorded.  Lungs: chest clear, no wheezing, rales, normal symmetric air entry  Heart: regular rate and rhythm, S1, S2 normal, no murmur, click, rub or gallop  Abdomen: uterus firm at umb  Perineum: is normal  Lochia: light  Extremities:no redness or tenderness in the calves or thighs, no edema    A/P: PPD # 2/ [id number] Chronic HTN off med  Doing well  Continue routine post partum orders  D/c instructions reviewed F/u with Dr Duke Salvia  1 wk F/u 6 wk pp

## 2022-12-29 NOTE — Discharge Instructions (Signed)
Call if temperature greater than equal to 100.4, nothing per vagina for 4-6 weeks or severe nausea vomiting, increased incisional pain , drainage or redness in the incision site, no straining with bowel movements, showers no bath °

## 2022-12-29 NOTE — Plan of Care (Signed)
  Problem: Education: Goal: Knowledge of General Education information will improve Description: Including pain rating scale, medication(s)/side effects and non-pharmacologic comfort measures Outcome: Progressing   Problem: Health Behavior/Discharge Planning: Goal: Ability to manage health-related needs will improve Outcome: Progressing   Problem: Clinical Measurements: Goal: Ability to maintain clinical measurements within normal limits will improve Outcome: Progressing Goal: Will remain free from infection Outcome: Progressing Goal: Diagnostic test results will improve Outcome: Progressing Goal: Respiratory complications will improve Outcome: Progressing Goal: Cardiovascular complication will be avoided Outcome: Progressing   Problem: Activity: Goal: Risk for activity intolerance will decrease Outcome: Progressing   Problem: Nutrition: Goal: Adequate nutrition will be maintained Outcome: Progressing   Problem: Coping: Goal: Level of anxiety will decrease Outcome: Progressing   Problem: Elimination: Goal: Will not experience complications related to bowel motility Outcome: Progressing Goal: Will not experience complications related to urinary retention Outcome: Progressing   Problem: Pain Management: Goal: General experience of comfort will improve Outcome: Progressing   Problem: Safety: Goal: Ability to remain free from injury will improve Outcome: Progressing   Problem: Skin Integrity: Goal: Risk for impaired skin integrity will decrease Outcome: Progressing   Problem: Education: Goal: Knowledge of condition will improve Outcome: Progressing Goal: Individualized Educational Video(s) Outcome: Progressing Goal: Individualized Newborn Educational Video(s) Outcome: Progressing   Problem: Activity: Goal: Will verbalize the importance of balancing activity with adequate rest periods Outcome: Progressing Goal: Ability to tolerate increased activity will  improve Outcome: Progressing   Problem: Coping: Goal: Ability to identify and utilize available resources and services will improve Outcome: Progressing   Problem: Life Cycle: Goal: Chance of risk for complications during the postpartum period will decrease Outcome: Progressing   Problem: Role Relationship: Goal: Ability to demonstrate positive interaction with newborn will improve Outcome: Progressing   Problem: Skin Integrity: Goal: Demonstration of wound healing without infection will improve Outcome: Progressing

## 2022-12-29 NOTE — Lactation Note (Signed)
This note was copied from a baby's chart. Lactation Consultation Note  Patient Name: Kaitlyn Choi [id number]'[id number] Date: 12/29/2022 Age:43 hours - Dr. Ezequiel Choi asked this LC to see dyad - baby changed to a baby patient to work on breast feeding.  Reason for consult: Follow-up assessment;Primapara;1st time breastfeeding;Term;Infant weight loss;Breastfeeding assistance;Difficult latch (5 % weight loss,) Baby due to feed, LC offered to assist and mom receptive.  LC changed a mec smear diaper, and placed the baby STS on the left breast, areola noted to be semi compressible, and with the T - hold was able to latch the baby and obtain depth. Baby fed for 8 mins and released. LC mentioned she has a hard time latching the baby to stay.  Mom is aware her nails need to be removed so it will be easier and will be doing that when she goes home.  Mom able to help alittle bit better on the right breast with LC . Per mom her mom will be helping at home so [facility] showed her how she could assist to latch and obtain the depth.. baby fed 17 mins - see doc flow sheets.  LC reassured and encouraged mom to prepump prior to latch with the hand pump to stretch the nipple / areola complex and showed her how to do the reverse pressure so baby would have more to latch onto.  Latch score 7 1st breast and 8 on the 2nd breast.  LC reassured mom  BF will improve when milk starts coming in [facility] plan : until the milk comes in.  1st breast - massage, hand express, pre-pump hand pump , reverse pressure , latch with firm support . ( Football will probably work the best )  Supplement with donor milk 25 -30 ml - settle baby' Post pump both breast using the #24 F and post pump both breast for 15 mins and save the milk for the next feeding.   Maternal Data Has patient been taught Hand Expression?: Yes  Feeding Mother's Current Feeding Choice: Breast Milk and Donor Milk  LATCH Score Latch: Grasps breast easily, tongue down, lips flanged,  rhythmical sucking.  Audible Swallowing: A few with stimulation  Type of Nipple: Everted at rest and after stimulation (areola semi compressible , reverse pressure and tea hold position by LC worked well)  Comfort (Breast/Nipple): Soft / non-tender  Hold (Positioning): Full assist, staff holds infant at breast  LATCH Score: 7   Lactation Tools Discussed/Used Tools: Pump;Flanges Flange Size: 24 Breast pump type: Manual;Double-Electric Breast Pump Pump Education: Milk Storage;Setup, frequency, and cleaning Pumping frequency: per mom has pumped x 4 in the last 24 hours Pumped volume:  (per mom drops)  Interventions Interventions: Breast feeding basics reviewed;Assisted with latch;Skin to skin;Breast massage;Hand express;Pre-pump if needed;Reverse pressure;Adjust position;Breast compression;Support pillows;Position options;Hand pump;DEBP;Education;Pace feeding;LC Services brochure  Discharge Pump: Personal;DEBP;Manual  Consult Status Consult Status: Follow-up Date: 12/29/22 Follow-up type: In-patient    Kaitlyn Choi Williom Cedar 12/29/2022, 2:27 PM

## 2022-12-30 ENCOUNTER — Ambulatory Visit (HOSPITAL_COMMUNITY): Payer: Self-pay

## 2022-12-30 NOTE — Lactation Note (Addendum)
This note was copied from a baby's chart. Lactation Consultation Note  Patient Name: Kaitlyn Choi ZOXWR'[id number] Date: 12/30/2022 Age:43 hours  Reason for consult: Follow-up assessment;Primapara;1st time breastfeeding;Breastfeeding assistance;Term;Infant < 6lbs  P1, [redacted]w[redacted]d, 5% weight loss, Mother:AMA  Mother is very motivated to breastfeed. She was very excited that she was able to express 2 mL of colostrum this morning and reports baby has latched very well for last 3 feedings. Observed mother latch baby independently and slight assistance for baby to attach deeper into the breast, taught breast compression and ways to keep baby engaged in the feeding. Colostrum observed prior to the latch and occasional swallow noted.   Mother is receptive to continue with the current feeding plan until her milk is established. She would like a follow up lactation appointment for continued breastfeeding support and assessment. Referral sent to [facility], Noralee Stain, RN, [employer].   Discussed the process of milk production, "supply and demand" and the importance of breast stimulation and milk removal in order to make an optimal milk supply.   Discussed mother to breastfeed 8-12 times in 24 hours, skin to skin and breast feed before EBM/formula feeding.    If missed feedings at breast or substituting feeding with formula, advised to hand express and/or pump to remove milk from the breast. Encouraged to pump every 3 hours.    Pediatrician appointment tomorrow, Nov 1    Feeding Nipple Type: Slow - flow  LATCH Score Latch: Grasps breast easily, tongue down, lips flanged, rhythmical sucking.  Audible Swallowing: A few with stimulation  Type of Nipple: Everted at rest and after stimulation  Comfort (Breast/Nipple): Soft / non-tender  Hold (Positioning): Assistance needed to correctly position infant at breast and maintain latch.  LATCH Score: 8   Lactation Tools Discussed/Used     Interventions Interventions: Breast feeding basics reviewed;Assisted with latch;Skin to skin;Hand express;Breast compression;Adjust position;Support pillows;Education;LC Services brochure  Discharge Discharge Education: Engorgement and breast care;Warning signs for feeding baby;Outpatient recommendation;Outpatient Epic message sent Pump: Manual (mother is going to rent [employer] pump to promote mother's milk supply)  Consult Status Consult Status: Complete Date: 12/30/22 Follow-up type: In-patient    Christella Hartigan M 12/30/2022, 11:41 AM

## 2023-01-04 ENCOUNTER — Telehealth: Payer: Self-pay | Admitting: Cardiovascular Disease

## 2023-01-04 NOTE — Telephone Encounter (Signed)
Returned call to patient,   Verified with patient that her BP has been under 130 since having the baby. She will plan to remain off the procardia unless Dr. Duke Salvia feels otherwise. Will route for review.

## 2023-01-04 NOTE — Telephone Encounter (Signed)
Pt c/o BP issue: STAT if pt c/o blurred vision, one-sided weakness or slurred speech  1. What are your last 5 BP readings? 127/71 on 12/30/22  2. Are you having any other symptoms (ex. Dizziness, headache, blurred vision, passed out)? No   3. What is your BP issue? O/B doctor took patient off her BP medicine    NIFEdipine (PROCARDIA XL/NIFEDICAL XL) 60 MG 24 hr tablet Take 1 tablet (60 mg total) by mouth daily.  O/B told patient to call and discuss this with Dr Duke Salvia to make sure it was ok. Per pt since she has given birth her blood pressure is much better.

## 2023-01-13 ENCOUNTER — Telehealth (HOSPITAL_COMMUNITY): Payer: Self-pay | Admitting: *Deleted

## 2023-01-13 NOTE — Telephone Encounter (Signed)
01/13/2023  Name: Kaitlyn Choi MRN: [id number] DOB: Feb 07, 1981  Reason for Call:  Transition of Care Hospital Discharge Call  Contact Status: Patient Contact Status: Message  Language assistant needed: Interpreter Mode: Interpreter Not Needed        Follow-Up Questions: Do You Have Any Concerns About Your Health As You Heal From Delivery?: No Do You Have Any Concerns About Your Infants Health?: Yes What Concerns Do You Have About Your Baby?: Umbilical cord came off, but the site does not appear completely healed. No fever. No drainage, odor, redness, or swelling at site.  Advised that it can be normal for the cord stump to come off and the insertion site to still take time to completely heal.  Patient intents to continue sponge baths until site is healed or pediatrician assesses site and says that tub baths are fine.  Edinburgh Postnatal Depression Scale:  In the Past 7 Days: I have been able to laugh and see the funny side of things.: As much as I always could I have looked forward with enjoyment to things.: As much as I ever did I have blamed myself unnecessarily when things went wrong.: Yes, some of the time I have been anxious or worried for no good reason.: Hardly ever I have felt scared or panicky for no good reason.: Yes, sometimes Things have been getting on top of me.: No, I have been coping as well as ever I have been so unhappy that I have had difficulty sleeping.: Not at all I have felt sad or miserable.: No, not at all I have been so unhappy that I have been crying.: No, never The thought of harming myself has occurred to me.: Never Edinburgh Postnatal Depression Scale Total: 5  PHQ2-9 Depression Scale:     Discharge Follow-up: Edinburgh score requires follow up?: No Patient was advised of the following resources:: Support Group, Breastfeeding Support Group  Post-discharge interventions: NA  Salena Saner, RN 01/13/2023 11:07

## 2023-01-13 NOTE — Telephone Encounter (Signed)
If BP routinely <130/80 may remain off Nifedipine. Ensure checking BP 2-3 times per week and if persistently >130/80 to contact our office.   Alver Sorrow, NP

## 2023-01-14 NOTE — Telephone Encounter (Signed)
Advised patient, verbalized understanding  

## 2023-01-17 ENCOUNTER — Inpatient Hospital Stay (HOSPITAL_COMMUNITY)
Admission: AD | Admit: 2023-01-17 | Discharge: 2023-01-18 | Disposition: A | Discharge status: Home / Self Care | Payer: BC Managed Care – PPO | Attending: Obstetrics and Gynecology | Admitting: Obstetrics and Gynecology

## 2023-01-17 DIAGNOSIS — N61 Mastitis without abscess: Secondary | ICD-10-CM | POA: Insufficient documentation

## 2023-01-17 MED ORDER — DICLOXACILLIN SODIUM 500 MG PO CAPS: 500.0000 mg | ORAL_CAPSULE | Freq: Four times a day (QID) | ORAL | 0 refills | Status: AC

## 2023-01-17 MED ORDER — ACETAMINOPHEN 500 MG PO TABS
1000.0000 mg | ORAL_TABLET | Freq: Once | ORAL | Status: AC
  Administered 2023-01-17: 1000 mg via ORAL
  Filled 2023-01-17: qty 2

## 2023-01-17 MED ORDER — DICLOXACILLIN SODIUM 250 MG PO CAPS
500.0000 mg | ORAL_CAPSULE | Freq: Four times a day (QID) | ORAL | Status: DC
  Administered 2023-01-17: 500 mg via ORAL
  Filled 2023-01-17: qty 1
  Filled 2023-01-17 (×2): qty 2
  Filled 2023-01-17: qty 1

## 2023-01-17 NOTE — MAU Note (Signed)
History     Chief Complaint  Patient presents with   Generalized Body Aches   Chills   Breast Pain   42 yo [id number] SBF s/p VD 3 wk ago presents with c/o fever to 102 at home, leg and hip ache and both breast painful ( L>>>R). Pt has been BRF and pumping but was not getting a lot of milk out today  OB History     Gravida  4   Para  1   Term  1   Preterm  0   AB  3   Living  1      SAB  1   IAB  2   Ectopic  0   Multiple  0   Live Births  1           Past Medical History:  Diagnosis Date   Abnormal urinalysis 12/17/2020   Allergic conjunctivitis, left eye 09/04/2021   Annual physical exam 04/16/2021   Anxiety    Anxiety and depression 12/17/2020   Asthma    Asthma action plan declined 12/17/1983   Chronic right-sided low back pain without sciatica 12/17/2020   Constipation 09/04/2021   COVID 03/12/2021   Depression    Flank pain 12/17/2020   Hypertension    Insomnia    Right upper quadrant pain 04/16/2021    Past Surgical History:  Procedure Laterality Date   ANKLE FRACTURE SURGERY Right 12/2015    Family History  Problem Relation Age of Onset   Hypertension Father    Stroke Father    Cancer Maternal Aunt    Stroke Maternal Uncle    Cancer Paternal Uncle    Cancer Maternal Grandmother    Heart disease Maternal Grandmother    Hypertension Maternal Grandmother    Lung cancer Maternal Grandmother    Sudden Cardiac Death Maternal Grandfather    Diabetes Paternal Grandmother    Cancer Paternal Grandmother    Asthma Paternal Grandmother    Hypertension Paternal Grandmother    Kidney failure Paternal Grandmother    Breast cancer Paternal Grandmother    Stroke Paternal Grandfather     Social History   Tobacco Use   Smoking status: Never   Smokeless tobacco: Never  Vaping Use   Vaping status: Never Used  Substance Use Topics   Alcohol use: Not Currently    Alcohol/week: 5.0 standard drinks of alcohol    Types: 5 Standard drinks or  equivalent per week   Drug use: Never    Allergies:  Allergies  Allergen Reactions   Latex Rash    Medications Prior to Admission  Medication Sig Dispense Refill Last Dose   ibuprofen (ADVIL) 600 MG tablet Take 1 tablet (600 mg total) by mouth every 6 (six) hours as needed. 30 tablet 11 01/17/2023 at 1000   sertraline (ZOLOFT) 50 MG tablet Take 50 mg by mouth daily.   01/17/2023 at 1000   vitamin C (ASCORBIC ACID) 250 MG tablet Take 500 mg by mouth daily.        Physical Exam   Blood pressure 109/66, pulse (!) 108, temperature 98.6 F (37 C), temperature source Oral, resp. rate 17, height 5\' 7"  (1.702 m), weight 103.7 kg, last menstrual period 03/27/2022, SpO2 98%, currently breastfeeding.  General appearance: alert, cooperative, and no distress Breasts:  large pendulous breast leaking milk. Soft. Focal tender medial lower inner left breast with erythema.  Areolas discolored bilaterally ED Course  IMP: left mastitis P) dicloxacillin 500 mg po now. Tylenol  1 g po now. Lactation Research scientist (medical). D/c home MDM    Serita Kyle, MD 11:09 PM 01/17/2023

## 2023-01-17 NOTE — MAU Note (Signed)
Kaitlyn Choi is a 42 y.o. postpartum patient here in MAU reporting: Pt reports pain, swelling, and fullness in her breast, especially in her left breast. Pt attempted to pump, but couldn't get anything out. Pt also reports body aches, chills, and runny nose. Pt reports she thought it was covid so she took an at home test that was negative.   Onset of complaint: 0930 Pain score:  Generalized 4/10 Breast 8/10  Vitals:   01/17/23 2115  BP: 109/66  Pulse: (!) 108  Resp: 17  Temp: 98.6 F (37 C)  SpO2: 98%      Lab orders placed from triage:  none

## 2023-01-17 NOTE — Lactation Note (Signed)
Lactation Consultation Note  Patient Name: Kaitlyn Choi [id number] Date: 01/17/2023 Age:42 y.o. Reason for consult: Initial assessment;Primapara;1st time breastfeeding;Engorgement (mastitis)  P1- LC called to consult MOB in MAU due to signs/symptoms of mastitis on the left breast. MOB reports that feeding her infant has been extremely painful, as well as pumping. Today MOB had a 102 fever and body chills. Her left breast has been causing pain that radiates (per MOB). LC assessed MOB's breasts and noted a dollar coin sized red spot on the under part of MOB's left breast near the sternum.  MOB's OB prescribed her antibiotics. LC recommended wet heat for 5-10 min before latching/pumping, then offer breast to infant or pump, then ice breasts for 15 minutes post feeding. MOB has been using size 24 mm flanges, but MOB needs a larger size, so LC provided her with 27 mm flanges. LC also provided MOB with reusable ice packs and hydrogel pads of her nipples. LC encouraged MOB to call lactation team for further assistance as needed.  Feeding Mother's Current Feeding Choice: Breast Milk  Interventions Interventions: Comfort gels;Hand pump;Ice;Education  Discharge Discharge Education: Engorgement and breast care Pump: Manual;Hands Free;Personal;Rented  Consult Status Consult Status: Complete Date: 01/17/23    Dema Severin BS, IBCLC 01/17/2023, 11:22 PM

## 2023-01-17 NOTE — MAU Note (Signed)
LC at bedside  

## 2023-02-28 ENCOUNTER — Ambulatory Visit
Admission: RE | Admit: 2023-02-28 | Discharge: 2023-02-28 | Disposition: A | Discharge status: Home / Self Care | Payer: BC Managed Care – PPO | Source: Ambulatory Visit | Attending: Family Medicine | Admitting: Family Medicine

## 2023-02-28 VITALS — BP 127/80 | HR 77 | Temp 98.2°F | Resp 16

## 2023-02-28 DIAGNOSIS — J4530 Mild persistent asthma, uncomplicated: Secondary | ICD-10-CM | POA: Diagnosis not present

## 2023-02-28 DIAGNOSIS — J988 Other specified respiratory disorders: Secondary | ICD-10-CM | POA: Diagnosis not present

## 2023-02-28 DIAGNOSIS — B9789 Other viral agents as the cause of diseases classified elsewhere: Secondary | ICD-10-CM

## 2023-02-28 LAB — POC COVID19/FLU A&B COMBO
Covid Antigen, POC: NEGATIVE
Influenza A Antigen, POC: NEGATIVE
Influenza B Antigen, POC: NEGATIVE

## 2023-02-28 MED ORDER — ALBUTEROL SULFATE HFA 108 (90 BASE) MCG/ACT IN AERS
1.0000 | INHALATION_SPRAY | Freq: Four times a day (QID) | RESPIRATORY_TRACT | 0 refills | Status: DC | PRN
Start: 2023-02-28 — End: 2023-02-28

## 2023-02-28 MED ORDER — ALBUTEROL SULFATE HFA 108 (90 BASE) MCG/ACT IN AERS: 1.0000 | INHALATION_SPRAY | Freq: Four times a day (QID) | RESPIRATORY_TRACT | 0 refills | Status: AC | PRN

## 2023-02-28 MED ORDER — PREDNISONE 20 MG PO TABS: 20.0000 mg | ORAL_TABLET | Freq: Every day | ORAL | 0 refills | Status: DC

## 2023-02-28 NOTE — ED Provider Notes (Signed)
[facility]  Note:  This document was prepared using Conservation officer, historic buildings and may include unintentional dictation errors.  MRN: [id number] DOB: 28-Mar-1980  Subjective:   Kaitlyn Choi is a 42 y.o. female presenting for 5 day history of acute onset productive cough, sinus congestion, sinus headaches and chills.  Started to have some wheezing over the weekend.  Has been using her albuterol treatments.  Does not need a refill.  Patient is breast-feeding.  Would like a COVID flu test.  She did have exposure to the flu from her 62-month-old son.  No current facility-administered medications for this encounter.  Current Outpatient Medications:    ibuprofen (ADVIL) 600 MG tablet, Take 1 tablet (600 mg total) by mouth every 6 (six) hours as needed., Disp: 30 tablet, Rfl: 11   sertraline (ZOLOFT) 50 MG tablet, Take 50 mg by mouth daily., Disp: , Rfl:    vitamin C (ASCORBIC ACID) 250 MG tablet, Take 500 mg by mouth daily., Disp: , Rfl:    Allergies  Allergen Reactions   Latex Rash    Past Medical History:  Diagnosis Date   Abnormal urinalysis 12/17/2020   Allergic conjunctivitis, left eye 09/04/2021   Annual physical exam 04/16/2021   Anxiety    Anxiety and depression 12/17/2020   Asthma    Asthma action plan declined 12/17/1983   Chronic right-sided low back pain without sciatica 12/17/2020   Constipation 09/04/2021   COVID 03/12/2021   Depression    Flank pain 12/17/2020   Hypertension    Insomnia    Right upper quadrant pain 04/16/2021     Past Surgical History:  Procedure Laterality Date   ANKLE FRACTURE SURGERY Right 12/2015    Family History  Problem Relation Age of Onset   Hypertension Father    Stroke Father    Cancer Maternal Aunt    Stroke Maternal Uncle    Cancer Paternal Uncle    Cancer Maternal Grandmother    Heart disease Maternal Grandmother    Hypertension Maternal Grandmother    Lung cancer Maternal Grandmother     Sudden Cardiac Death Maternal Grandfather    Diabetes Paternal Grandmother    Cancer Paternal Grandmother    Asthma Paternal Grandmother    Hypertension Paternal Grandmother    Kidney failure Paternal Grandmother    Breast cancer Paternal Grandmother    Stroke Paternal Grandfather     Social History   Tobacco Use   Smoking status: Never   Smokeless tobacco: Never  Vaping Use   Vaping status: Never Used  Substance Use Topics   Alcohol use: Not Currently   Drug use: Never    ROS   Objective:   Vitals: BP 127/80 (BP Location: Left Arm)   Pulse 77   Temp 98.2 F (36.8 C) (Oral)   Resp 16   LMP 03/27/2022   SpO2 95%   Breastfeeding Yes   Physical Exam Constitutional:      General: She is not in acute distress.    Appearance: Normal appearance. She is well-developed and normal weight. She is not ill-appearing, toxic-appearing or diaphoretic.  HENT:     Head: Normocephalic and atraumatic.     Right Ear: Tympanic membrane, ear canal and external ear normal. No drainage or tenderness. No middle ear effusion. There is no impacted cerumen. Tympanic membrane is not erythematous or bulging.     Left Ear: Tympanic membrane, ear canal and external ear normal. No drainage or tenderness.  No middle  ear effusion. There is no impacted cerumen. Tympanic membrane is not erythematous or bulging.     Nose: Nose normal. No congestion or rhinorrhea.     Mouth/Throat:     Mouth: Mucous membranes are moist. No oral lesions.     Pharynx: No pharyngeal swelling, oropharyngeal exudate, posterior oropharyngeal erythema or uvula swelling.     Tonsils: No tonsillar exudate or tonsillar abscesses.  Eyes:     General: No scleral icterus.       Right eye: No discharge.        Left eye: No discharge.     Extraocular Movements: Extraocular movements intact.     Right eye: Normal extraocular motion.     Left eye: Normal extraocular motion.     Conjunctiva/sclera: Conjunctivae normal.   Cardiovascular:     Rate and Rhythm: Normal rate and regular rhythm.     Heart sounds: Normal heart sounds. No murmur heard.    No friction rub. No gallop.  Pulmonary:     Effort: Pulmonary effort is normal. No respiratory distress.     Breath sounds: No stridor. Wheezing (mild over mid lung fields bilaterally) present. No rhonchi or rales.  Chest:     Chest wall: No tenderness.  Musculoskeletal:     Cervical back: Normal range of motion and neck supple.  Lymphadenopathy:     Cervical: No cervical adenopathy.  Skin:    General: Skin is warm and dry.  Neurological:     General: No focal deficit present.     Mental Status: She is alert and oriented to person, place, and time.  Psychiatric:        Mood and Affect: Mood normal.        Behavior: Behavior normal.     Results for orders placed or performed during the hospital encounter of 02/28/23 (from the past 24 hours)  POC Covid + Flu A/B Antigen     Status: None   Collection Time: 02/28/23 12:25 PM  Result Value Ref Range   Influenza A Antigen, POC Negative Negative   Influenza B Antigen, POC Negative Negative   Covid Antigen, POC Negative Negative    Assessment and Plan :   PDMP not reviewed this encounter.  1. Viral respiratory infection   2. Mild persistent asthma without complication    Recommend an oral prednisone course at 20mg  for 5 days.  Discussed use of this medication in the setting of breast-feeding.  Continue albuterol use, refilled.  Use supportive care otherwise.  Counseled patient on potential for adverse effects with medications prescribed/recommended today, ER and return-to-clinic precautions discussed, patient verbalized understanding.     Wallis Bamberg, [place] 02/28/23 1252

## 2023-02-28 NOTE — ED Triage Notes (Signed)
Pt c/o cough, head congestion, chills, HA-sx started 12/25-wheezing started 12/27 using albuterol neb and inhaler-NAD-steady gait

## 2023-03-18 ENCOUNTER — Ambulatory Visit (HOSPITAL_BASED_OUTPATIENT_CLINIC_OR_DEPARTMENT_OTHER): Payer: BC Managed Care – PPO | Admitting: Cardiovascular Disease

## 2023-03-31 ENCOUNTER — Encounter: Payer: BC Managed Care – PPO | Admitting: Family Medicine

## 2023-05-13 DIAGNOSIS — Z1231 Encounter for screening mammogram for malignant neoplasm of breast: Secondary | ICD-10-CM | POA: Diagnosis not present

## 2023-09-05 DIAGNOSIS — F4323 Adjustment disorder with mixed anxiety and depressed mood: Secondary | ICD-10-CM | POA: Diagnosis not present

## 2023-09-16 DIAGNOSIS — F4323 Adjustment disorder with mixed anxiety and depressed mood: Secondary | ICD-10-CM | POA: Diagnosis not present

## 2023-09-17 IMAGING — CR DG LUMBAR SPINE COMPLETE 4+V
6 series · 6 of 6 positions shown · non-contrast
Comparison: None.

CLINICAL DATA: Low back pain for years, no known injury, initial
encounter

EXAM:
LUMBAR SPINE - COMPLETE 4+ VIEW

[l-spine ap]
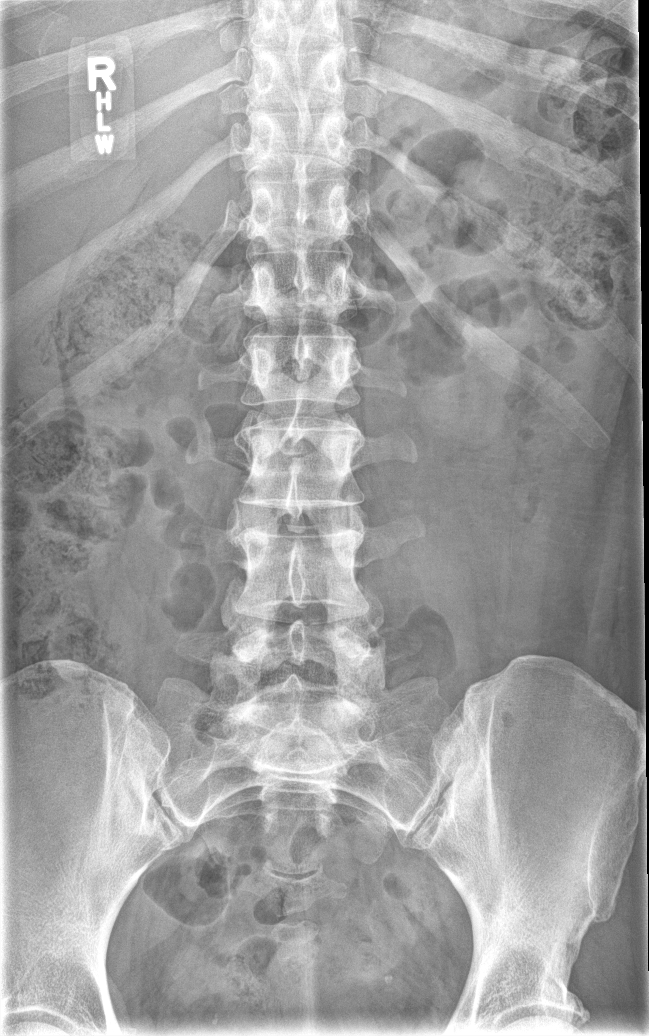

[l-spine obl (1 of 3)]
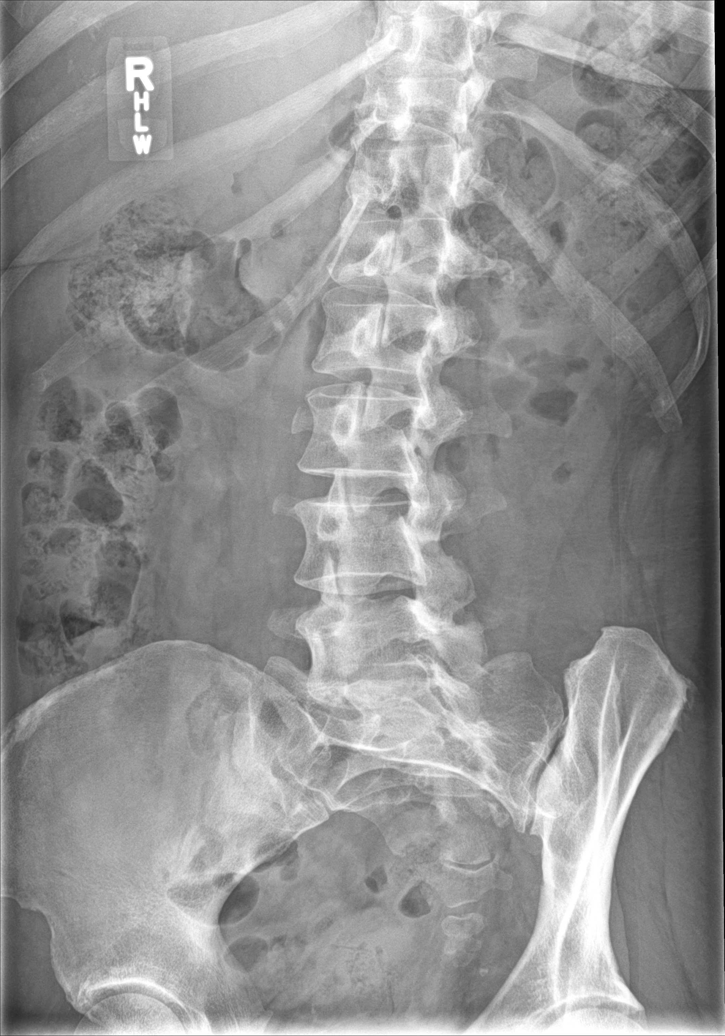

[l-spine obl (2 of 3)]
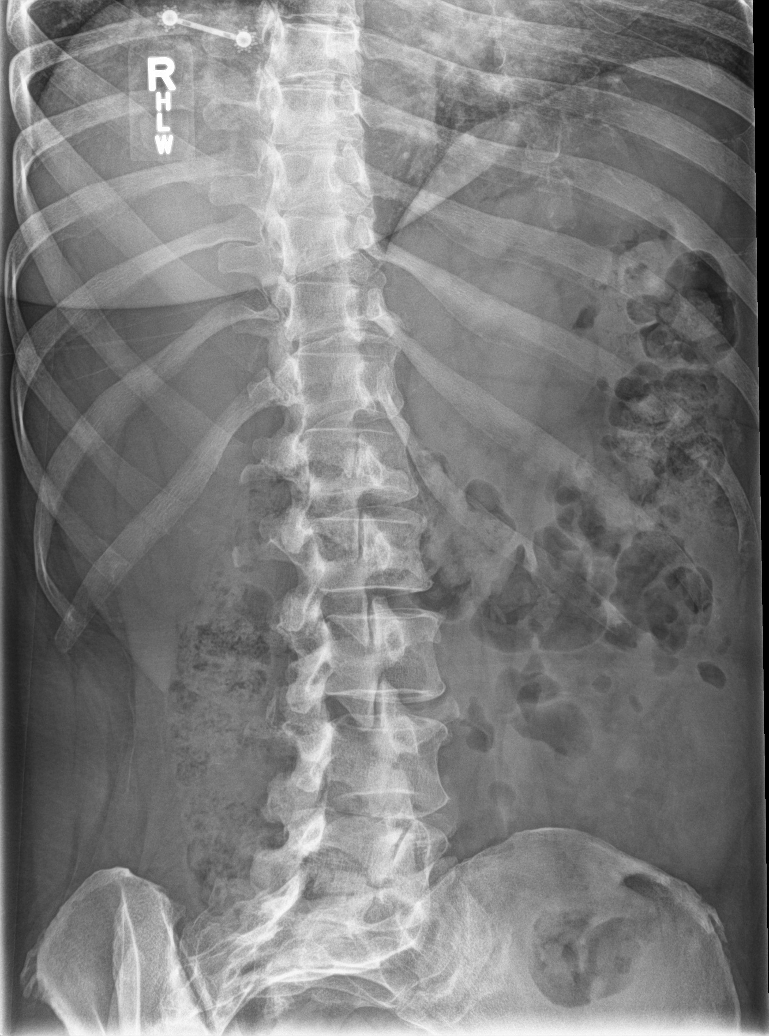

[l-spine lat]
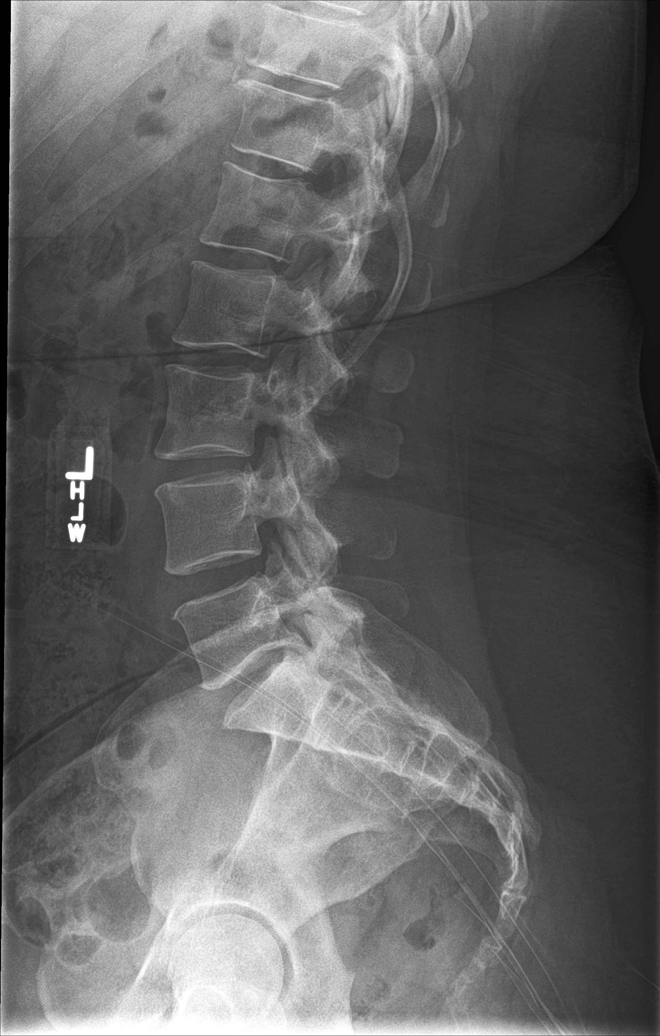

[l-spine spot]
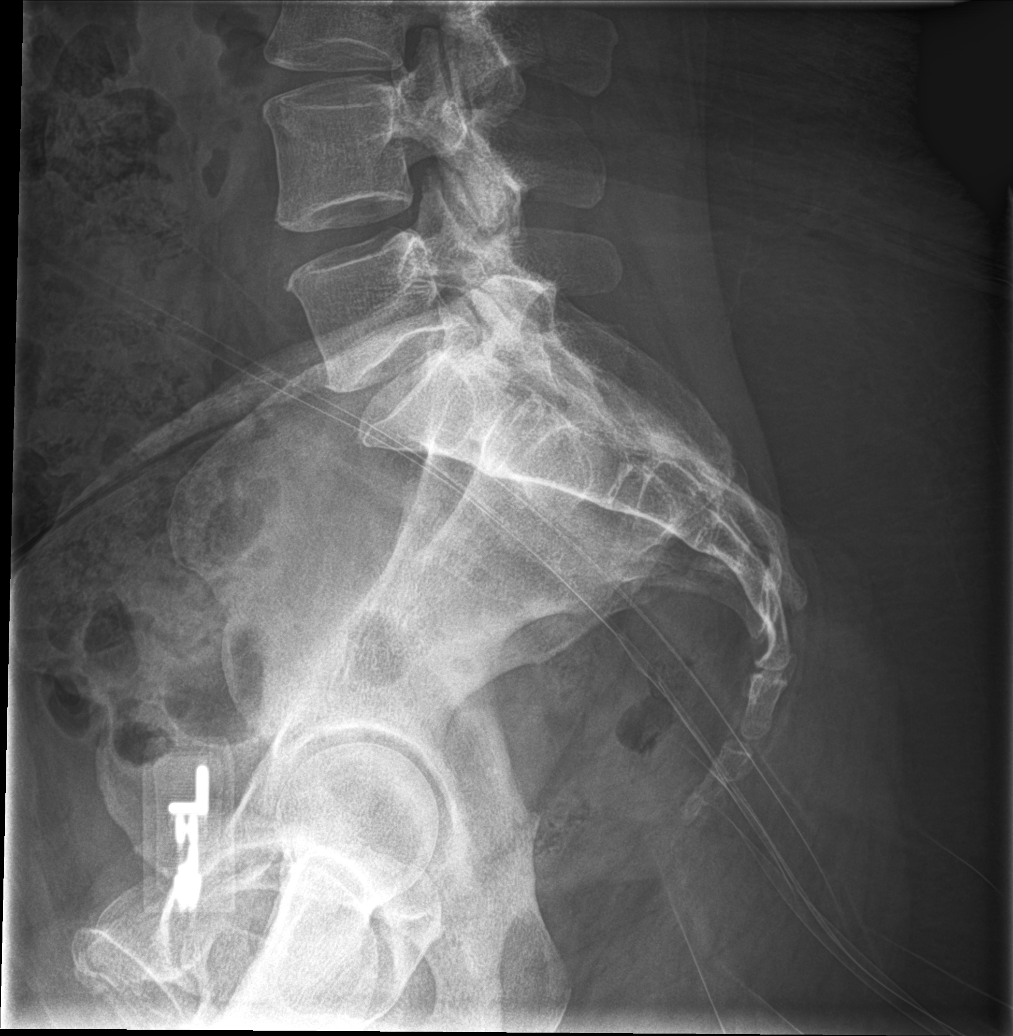

[l-spine obl (3 of 3)]
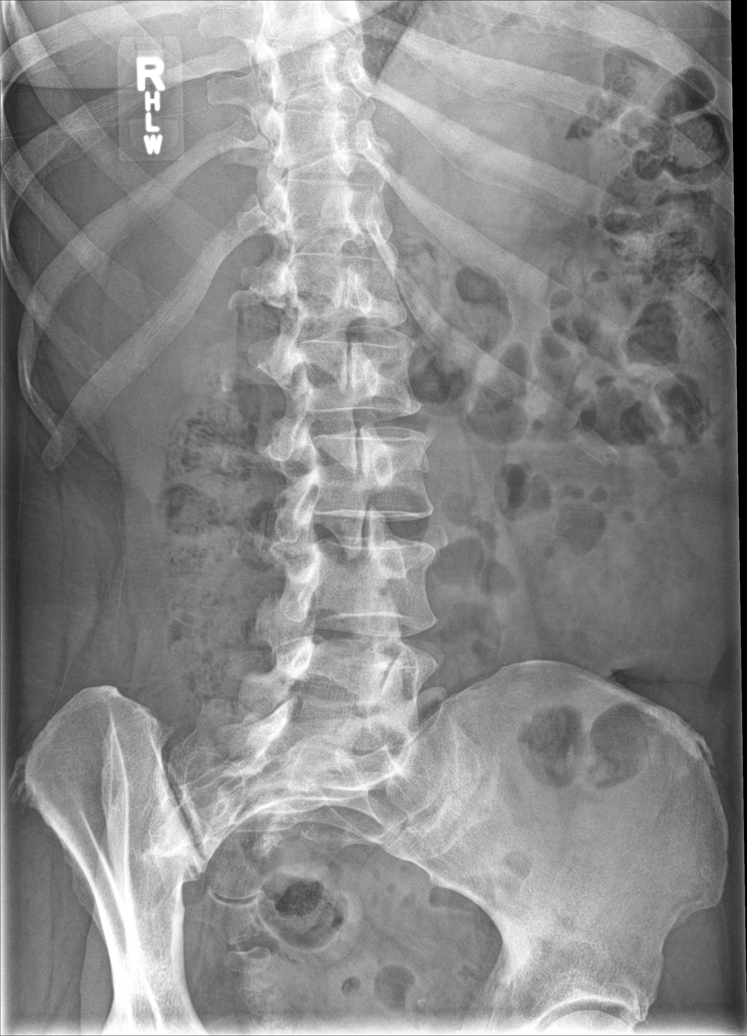

[6 of 6 positions shown; findings below may reference images not displayed]

FINDINGS: Five lumbar type vertebral bodies are well visualized. Vertebral
body height is well maintained. No pars defects are noted. No
anterolisthesis is seen. No soft tissue abnormality is noted.
IMPRESSION: No acute abnormality seen.

## 2023-09-17 IMAGING — CR DG HIP (WITH OR WITHOUT PELVIS) 2-3V*R*
3 series · 3 of 3 positions shown · non-contrast
Comparison: None.

CLINICAL DATA: Chronic right hip pain initial encounter

EXAM:
DG HIP (WITH OR WITHOUT PELVIS) 3V RIGHT

[pelvis ap]
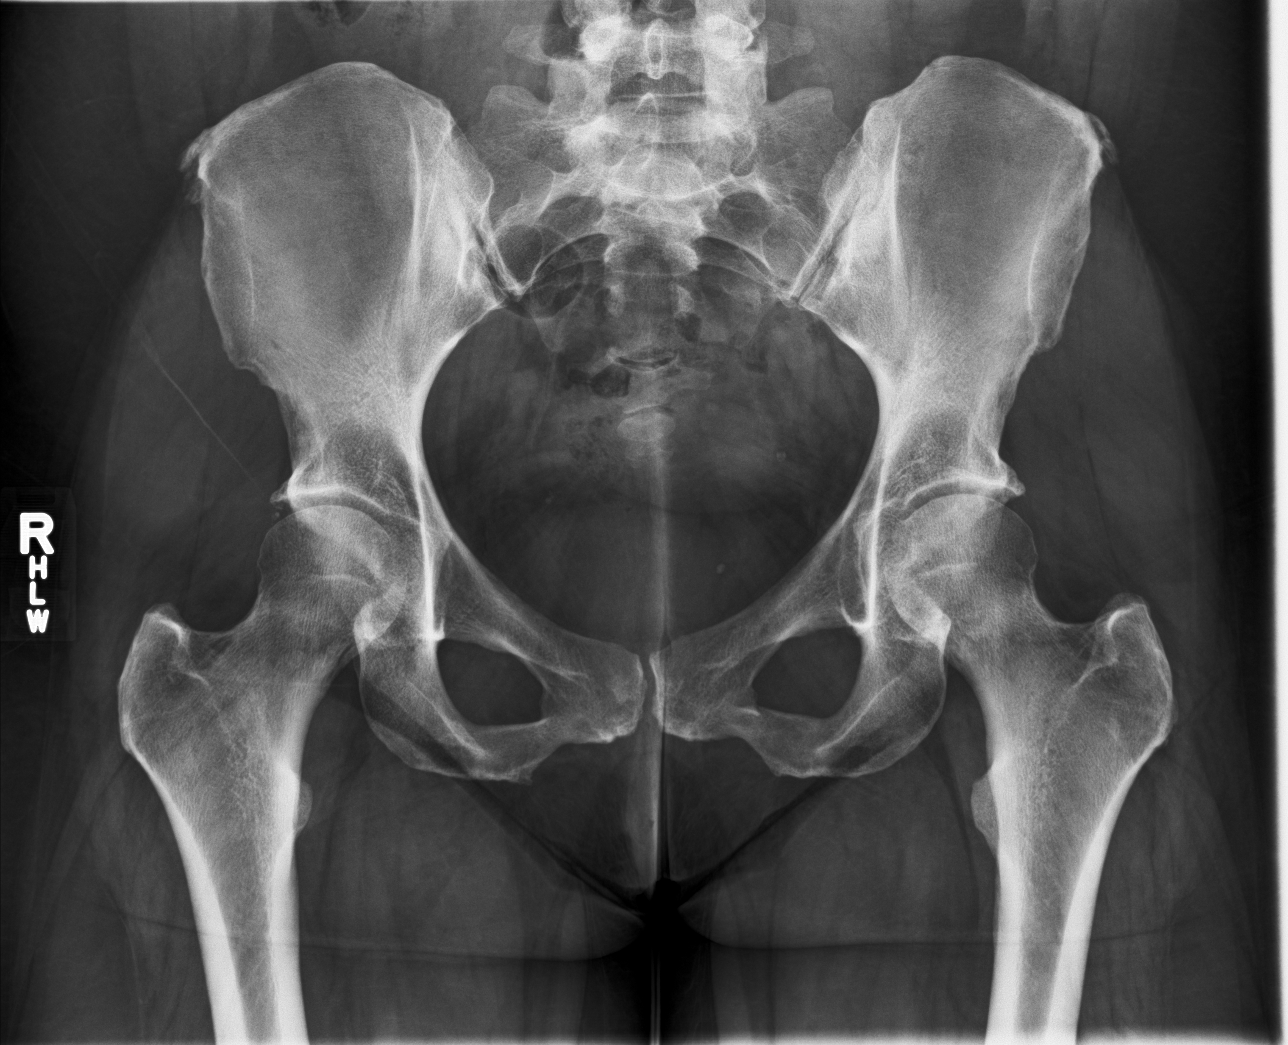

[hip ap]
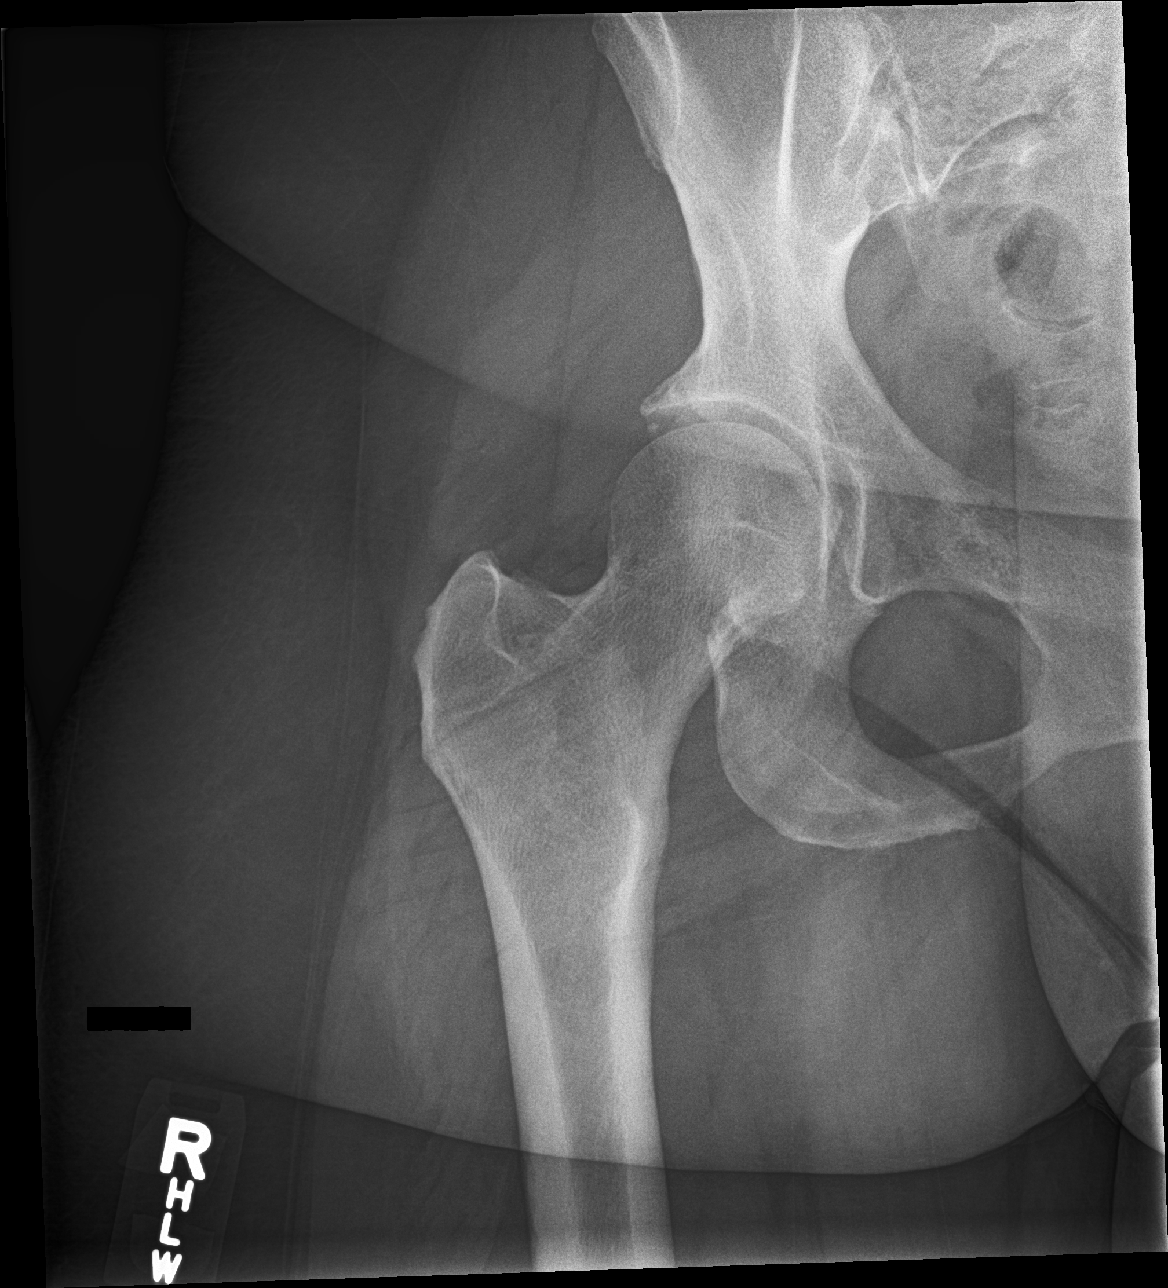

[hip lat]
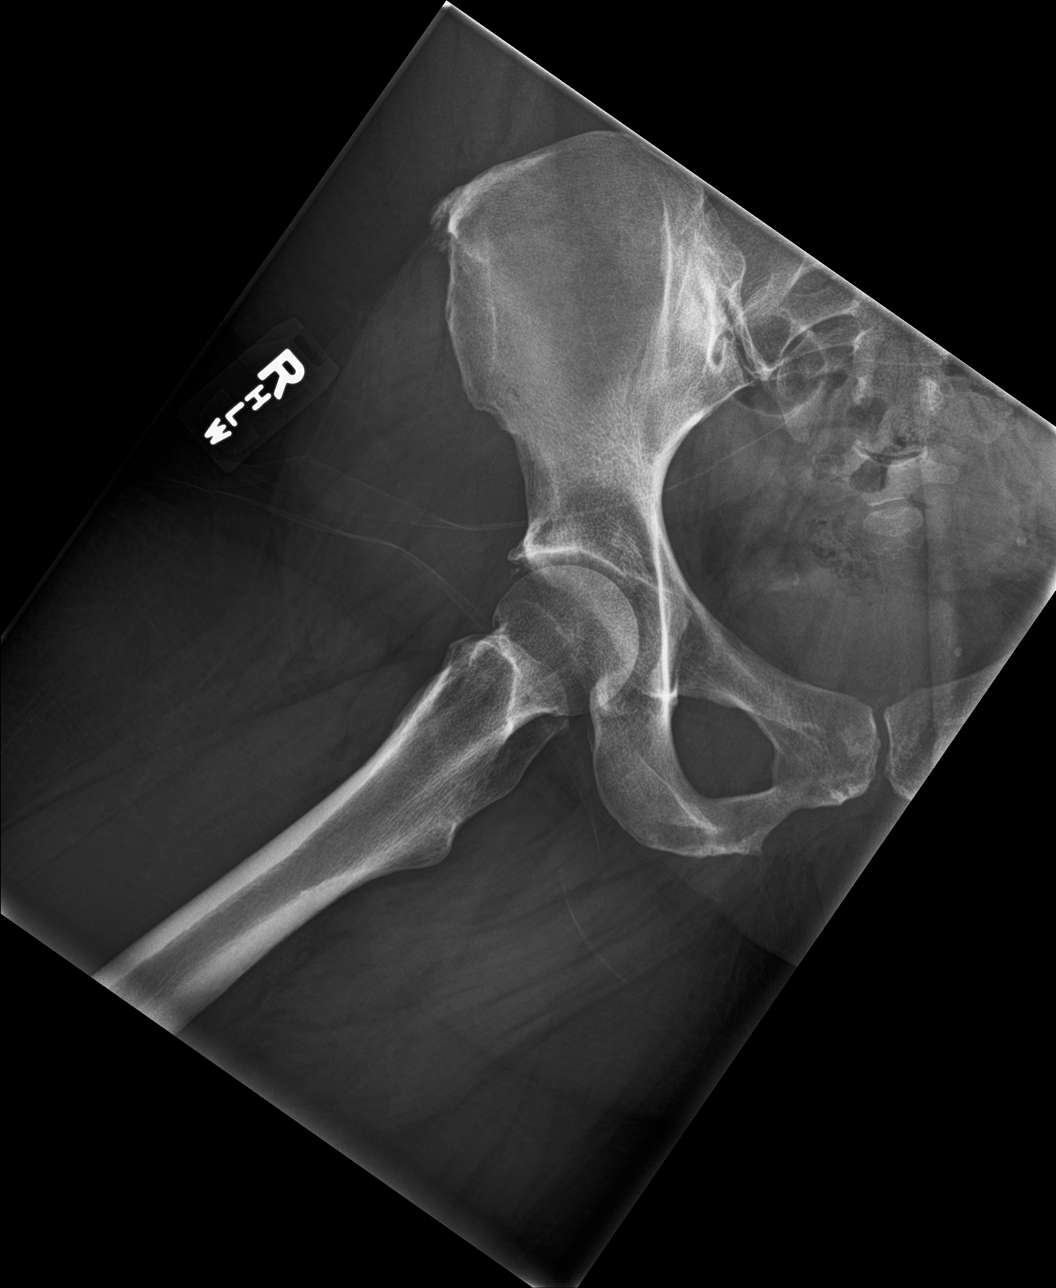

[3 of 3 positions shown; findings below may reference images not displayed]

FINDINGS: Pelvic ring is intact. Degenerative changes of the hip joints are
noted bilaterally. No acute fracture or dislocation is seen. No soft
tissue abnormality is noted.
IMPRESSION: No acute abnormality noted.

## 2023-09-23 DIAGNOSIS — F4323 Adjustment disorder with mixed anxiety and depressed mood: Secondary | ICD-10-CM | POA: Diagnosis not present

## 2023-09-30 DIAGNOSIS — F4323 Adjustment disorder with mixed anxiety and depressed mood: Secondary | ICD-10-CM | POA: Diagnosis not present

## 2023-10-07 DIAGNOSIS — F4323 Adjustment disorder with mixed anxiety and depressed mood: Secondary | ICD-10-CM | POA: Diagnosis not present

## 2023-10-17 ENCOUNTER — Encounter: Payer: Self-pay | Admitting: Family Medicine

## 2023-10-17 ENCOUNTER — Ambulatory Visit (INDEPENDENT_AMBULATORY_CARE_PROVIDER_SITE_OTHER): Payer: BC Managed Care – PPO | Admitting: Family Medicine

## 2023-10-17 VITALS — BP 136/94 | HR 107 | Temp 98.3°F | Ht 67.0 in | Wt 239.4 lb

## 2023-10-17 DIAGNOSIS — R635 Abnormal weight gain: Secondary | ICD-10-CM

## 2023-10-17 DIAGNOSIS — J302 Other seasonal allergic rhinitis: Secondary | ICD-10-CM | POA: Diagnosis not present

## 2023-10-17 DIAGNOSIS — F909 Attention-deficit hyperactivity disorder, unspecified type: Secondary | ICD-10-CM

## 2023-10-17 DIAGNOSIS — H6123 Impacted cerumen, bilateral: Secondary | ICD-10-CM

## 2023-10-17 DIAGNOSIS — Z8679 Personal history of other diseases of the circulatory system: Secondary | ICD-10-CM

## 2023-10-17 DIAGNOSIS — J4520 Mild intermittent asthma, uncomplicated: Secondary | ICD-10-CM | POA: Diagnosis not present

## 2023-10-17 DIAGNOSIS — F32A Depression, unspecified: Secondary | ICD-10-CM

## 2023-10-17 DIAGNOSIS — F419 Anxiety disorder, unspecified: Secondary | ICD-10-CM | POA: Diagnosis not present

## 2023-10-17 DIAGNOSIS — Z7689 Persons encountering health services in other specified circumstances: Secondary | ICD-10-CM

## 2023-10-17 NOTE — Progress Notes (Signed)
 Established Patient Office Visit   Subjective  Patient ID: Kaitlyn Choi, female    DOB: 07-18-1980  Age: 43 y.o. MRN: [id number]  Chief Complaint  Patient presents with   New Patient (Initial Visit)    Pt is a 43 year old female seen for establish care and follow-up on chronic conditions.  Patient previously seen in [place] but recently moved.  Followed by OB/GYN, Dr. Rutherford.  H/o HTN: Previously managed with nifedipine , d/c'd during her pregnancy in October 2024 due to normalization of BP. She has not been monitoring her blood pressure at home since then.  History of asthma: She uses an albuterol  inhaler as needed for asthma, typically triggered by allergies.  Has not had to use inhaler in a while.  Seasonal allergies: Takes Claritin D and another similar allergy medication rarely if needed.  On Zoloft  for anxiety and depression for approximately two years, with her current dose being 100 mg, which is working well for her.  She started taking Vyvanse about three months ago, beginning at 20 mg and increasing to 30 or 40 mg. She notes a significant improvement in symptoms since starting the medication, although it has decreased her appetite.  She previously used Wegovy  for weight loss for about six months before discontinuing it to become pregnant. She lost some weight during that time but does not recall the exact amount. Currently, she is not actively working on weight loss due to time constraints as a single mother. Her diet includes fast food, and she often skips meals due to her busy schedule.  Social history: Patient currently working from home.  She has a nine-month-old child and reports that her sleep has been disrupted, which she attributes to caring for her baby. She is not currently breastfeeding.    Patient Active Problem List   Diagnosis Date Noted   Postpartum care following vaginal delivery 12/28/2022   Chronic hypertension in pregnancy 12/25/2022   AMA (advanced  maternal age) multigravida 35+, second trimester 07/20/2022   Primary hypertension 05/20/2021   Class 2 severe obesity due to excess calories with serious comorbidity and body mass index (BMI) of 36.0 to 36.9 in adult [place]) 03/12/2021   Past Medical History:  Diagnosis Date   Abnormal urinalysis 12/17/2020   Allergic conjunctivitis, left eye 09/04/2021   Allergy Not sure   Skin allergies   Annual physical exam 04/16/2021   Anxiety    Anxiety and depression 12/17/2020   Asthma    Asthma action plan declined 12/17/1983   Chronic right-sided low back pain without sciatica 12/17/2020   Constipation 09/04/2021   COVID 03/12/2021   Depression    Flank pain 12/17/2020   Hypertension    Insomnia    Right upper quadrant pain 04/16/2021   Past Surgical History:  Procedure Laterality Date   ANKLE FRACTURE SURGERY Right 12/2015   EYE SURGERY  2016   PRK to correct vision.   FRACTURE SURGERY  2017   Ankle fracture   Social History   Tobacco Use   Smoking status: Never   Smokeless tobacco: Never  Vaping Use   Vaping status: Never Used  Substance Use Topics   Alcohol use: Not Currently   Drug use: Never   Family History  Problem Relation Age of Onset   Obesity Mother    Varicose Veins Mother    Hypertension Father    Stroke Father    Cancer Maternal Aunt    Stroke Maternal Uncle    Cancer Paternal Uncle  Cancer Maternal Grandmother    Heart disease Maternal Grandmother    Hypertension Maternal Grandmother    Lung cancer Maternal Grandmother    Depression Maternal Grandmother    Sudden Cardiac Death Maternal Grandfather    Cancer Maternal Grandfather    Diabetes Paternal Grandmother    Cancer Paternal Grandmother    Asthma Paternal Grandmother    Hypertension Paternal Grandmother    Kidney failure Paternal Grandmother    Breast cancer Paternal Grandmother    Kidney disease Paternal Grandmother    Obesity Paternal Grandmother    Stroke Paternal Grandfather     Depression Maternal Aunt    Varicose Veins Maternal Aunt    Depression Maternal Aunt    Obesity Maternal Aunt    Varicose Veins Maternal Aunt    Depression Maternal Uncle    Obesity Maternal Uncle    Obesity Maternal Aunt    Obesity Paternal Aunt    Allergies  Allergen Reactions   Latex Rash    ROS Negative unless stated above    Objective:     There were no vitals taken for this visit. BP Readings from Last 3 Encounters:  02/28/23 127/80  01/18/23 123/76  12/29/22 127/71   Wt Readings from Last 3 Encounters:  01/17/23 228 lb 11.2 oz (103.7 kg)  12/25/22 241 lb (109.3 kg)  07/29/22 245 lb 9.6 oz (111.4 kg)      Physical Exam Constitutional:      General: She is not in acute distress.    Appearance: Normal appearance.  HENT:     Head: Normocephalic and atraumatic.     Right Ear: There is impacted cerumen.     Left Ear: There is impacted cerumen.     Nose: Nose normal.     Mouth/Throat:     Mouth: Mucous membranes are moist.  Cardiovascular:     Rate and Rhythm: Normal rate and regular rhythm.     Heart sounds: Normal heart sounds. No murmur heard.    No gallop.  Pulmonary:     Effort: Pulmonary effort is normal. No respiratory distress.     Breath sounds: Normal breath sounds. No wheezing, rhonchi or rales.  Skin:    General: Skin is warm and dry.  Neurological:     Mental Status: She is alert and oriented to person, place, and time.        10/17/2023    3:45 PM 09/04/2021    1:08 PM 09/04/2021    8:48 AM  Depression screen PHQ 2/9  Decreased Interest 0 0 0  Down, Depressed, Hopeless 2 0 0  PHQ - 2 Score 2 0 0  Altered sleeping 1  1  Tired, decreased energy 0  0  Change in appetite 2  3  Feeling bad or failure about yourself  0  0  Trouble concentrating 0  0  Moving slowly or fidgety/restless 0  0  Suicidal thoughts 0  0  PHQ-9 Score 5  4  Difficult doing work/chores Somewhat difficult  Not difficult at all      10/17/2023    3:46 PM 09/04/2021     8:49 AM 07/20/2021    8:30 AM 05/20/2021   11:24 AM  GAD 7 : Generalized Anxiety Score  Nervous, Anxious, on Edge 1 0 0 1  Control/stop worrying 1 0 0 1  Worry too much - different things 1 0 1 0  Trouble relaxing 0 0 0 0  Restless 0 0 0 0  Easily annoyed  or irritable 0 1 1 1   Afraid - awful might happen 1 0 1 0  Total GAD 7 Score 4 1 3 3   Anxiety Difficulty Not difficult at all Not difficult at all  Somewhat difficult     No results found for any visits on 10/17/23.    Assessment & Plan:   Mild intermittent asthma without complication  Seasonal allergies  Attention deficit hyperactivity disorder (ADHD), unspecified ADHD type  Anxiety and depression  History of hypertension  Weight gain  Encounter to establish care  Bilateral impacted cerumen  Weight gain/obesity Body mass index is 37.5 kg/m.  Patient encouraged to increase activity.   Encourage meal prepping and increasing vegetable intake. Advise incorporating more physical activity, such as walking or quick exercise routines.  Hypertension   Previously on Meds prior to pregnancy.  Encourage monitoring blood pressure at home and advise reducing sodium intake by cooking more at home.  Asthma   Asthma is managed with an albuterol  inhaler as needed. Symptoms are primarily triggered by allergies, but asthma has not been bothersome recently.  Depression and anxiety   PHQ-9 score 5, GAD-7 score for this visit.  Can use Zoloft  100 mg daily.  Self-care encouraged.    ADHD Currently on Vyvanse.  Per PDMP prescribed by a provider in [place].  Allergic rhinitis   Allergic rhinitis is managed with Claritin D and a similar over-the-counter allergy medication.   Cerumen impaction   Cerumen impaction is noted, with earbud use contributing to buildup.  Return CPE.   Clotilda JONELLE Single, MD

## 2023-10-17 NOTE — Patient Instructions (Signed)
 It was nice meeting you today.  You can set up a physical at your convenience.  We can check labs at that visit.  If needed we can also place a referral to healthy weight clinic.

## 2023-10-21 DIAGNOSIS — F4323 Adjustment disorder with mixed anxiety and depressed mood: Secondary | ICD-10-CM | POA: Diagnosis not present

## 2023-10-28 DIAGNOSIS — F4323 Adjustment disorder with mixed anxiety and depressed mood: Secondary | ICD-10-CM | POA: Diagnosis not present

## 2023-11-18 DIAGNOSIS — F4323 Adjustment disorder with mixed anxiety and depressed mood: Secondary | ICD-10-CM | POA: Diagnosis not present

## 2023-11-25 DIAGNOSIS — F4323 Adjustment disorder with mixed anxiety and depressed mood: Secondary | ICD-10-CM | POA: Diagnosis not present

## 2023-12-09 DIAGNOSIS — F4323 Adjustment disorder with mixed anxiety and depressed mood: Secondary | ICD-10-CM | POA: Diagnosis not present

## 2023-12-15 DIAGNOSIS — F331 Major depressive disorder, recurrent, moderate: Secondary | ICD-10-CM | POA: Diagnosis not present

## 2023-12-15 DIAGNOSIS — G4700 Insomnia, unspecified: Secondary | ICD-10-CM | POA: Diagnosis not present

## 2023-12-15 DIAGNOSIS — F411 Generalized anxiety disorder: Secondary | ICD-10-CM | POA: Diagnosis not present

## 2023-12-15 DIAGNOSIS — F902 Attention-deficit hyperactivity disorder, combined type: Secondary | ICD-10-CM | POA: Diagnosis not present

## 2023-12-23 ENCOUNTER — Telehealth (HOSPITAL_BASED_OUTPATIENT_CLINIC_OR_DEPARTMENT_OTHER): Payer: Self-pay | Admitting: Cardiovascular Disease

## 2023-12-23 ENCOUNTER — Ambulatory Visit (INDEPENDENT_AMBULATORY_CARE_PROVIDER_SITE_OTHER): Admitting: Family Medicine

## 2023-12-23 ENCOUNTER — Encounter: Payer: Self-pay | Admitting: Family Medicine

## 2023-12-23 VITALS — BP 138/94 | HR 94 | Temp 98.1°F | Ht 67.0 in | Wt 242.0 lb

## 2023-12-23 DIAGNOSIS — Z6837 Body mass index (BMI) 37.0-37.9, adult: Secondary | ICD-10-CM

## 2023-12-23 DIAGNOSIS — Z0000 Encounter for general adult medical examination without abnormal findings: Secondary | ICD-10-CM | POA: Diagnosis not present

## 2023-12-23 DIAGNOSIS — Z23 Encounter for immunization: Secondary | ICD-10-CM | POA: Diagnosis not present

## 2023-12-23 DIAGNOSIS — M25473 Effusion, unspecified ankle: Secondary | ICD-10-CM

## 2023-12-23 DIAGNOSIS — I10 Essential (primary) hypertension: Secondary | ICD-10-CM

## 2023-12-23 DIAGNOSIS — Z0001 Encounter for general adult medical examination with abnormal findings: Secondary | ICD-10-CM | POA: Diagnosis not present

## 2023-12-23 DIAGNOSIS — F909 Attention-deficit hyperactivity disorder, unspecified type: Secondary | ICD-10-CM | POA: Diagnosis not present

## 2023-12-23 DIAGNOSIS — E66812 Obesity, class 2: Secondary | ICD-10-CM | POA: Diagnosis not present

## 2023-12-23 DIAGNOSIS — F4323 Adjustment disorder with mixed anxiety and depressed mood: Secondary | ICD-10-CM | POA: Diagnosis not present

## 2023-12-23 MED ORDER — NIFEDIPINE ER OSMOTIC RELEASE 30 MG PO TB24: 30.0000 mg | ORAL_TABLET | Freq: Every day | ORAL | 1 refills | Status: AC

## 2023-12-23 NOTE — Progress Notes (Addendum)
 Established Patient Office Visit   Subjective  Patient ID: Kaitlyn Choi, female    DOB: 1980-12-13  Age: 43 y.o. MRN: [id number]  Chief Complaint  Patient presents with   Annual Exam    Elevated blood pressure at home, 151/101, with headaches and nausea, patient also fell 3 weeks ago and had some bruising on the left thigh, there is a knot there patient denies any pain     Pt is a 43 yo female who recently est care (10/17/23) who is seen for CPE.  Pt mentions increased HAs, nausea and edema of b/l ankles x 2 days. BP elevated 150s/100s.  Not currently on meds.  Previously on Procardia  XL 60 mg last yr during pregnancy. Seeing outside provider, started on vyvanse for ADHD.    Patient Active Problem List   Diagnosis Date Noted   Postpartum care following vaginal delivery 12/28/2022   Chronic hypertension in pregnancy 12/25/2022   AMA (advanced maternal age) multigravida 35+, second trimester 07/20/2022   Primary hypertension 05/20/2021   Class 2 severe obesity due to excess calories with serious comorbidity and body mass index (BMI) of 36.0 to 36.9 in adult 03/12/2021   Past Medical History:  Diagnosis Date   Abnormal urinalysis 12/17/2020   Allergic conjunctivitis, left eye 09/04/2021   Allergy Not sure   Skin allergies   Annual physical exam 04/16/2021   Anxiety    Anxiety and depression 12/17/2020   Asthma    Asthma action plan declined 12/17/1983   Chronic right-sided low back pain without sciatica 12/17/2020   Constipation 09/04/2021   COVID 03/12/2021   Depression    Flank pain 12/17/2020   Hypertension    Insomnia    Right upper quadrant pain 04/16/2021   Past Surgical History:  Procedure Laterality Date   ANKLE FRACTURE SURGERY Right 12/2015   EYE SURGERY  2016   PRK to correct vision.   FRACTURE SURGERY  2017   Ankle fracture   Social History   Tobacco Use   Smoking status: Never   Smokeless tobacco: Never  Vaping Use   Vaping status: Never Used   Substance Use Topics   Alcohol use: Not Currently   Drug use: Never   Family History  Problem Relation Age of Onset   Obesity Mother    Varicose Veins Mother    Hypertension Father    Stroke Father    Cancer Maternal Aunt    Stroke Maternal Uncle    Cancer Paternal Uncle    Cancer Maternal Grandmother    Heart disease Maternal Grandmother    Hypertension Maternal Grandmother    Lung cancer Maternal Grandmother    Depression Maternal Grandmother    Sudden Cardiac Death Maternal Grandfather    Cancer Maternal Grandfather    Diabetes Paternal Grandmother    Cancer Paternal Grandmother    Asthma Paternal Grandmother    Hypertension Paternal Grandmother    Kidney failure Paternal Grandmother    Breast cancer Paternal Grandmother    Kidney disease Paternal Grandmother    Obesity Paternal Grandmother    Stroke Paternal Grandfather    Depression Maternal Aunt    Varicose Veins Maternal Aunt    Depression Maternal Aunt    Obesity Maternal Aunt    Varicose Veins Maternal Aunt    Depression Maternal Uncle    Obesity Maternal Uncle    Obesity Maternal Aunt    Obesity Paternal Aunt    Allergies  Allergen Reactions   Latex Rash  ROS Negative unless stated above    Objective:     BP (!) 138/94 (BP Location: Left Arm, Patient Position: Sitting, Cuff Size: Large)   Pulse 94   Temp 98.1 F (36.7 C) (Oral)   Ht 5' 7 (1.702 m)   Wt 242 lb (109.8 kg)   SpO2 99%   BMI 37.90 kg/m  BP Readings from Last 3 Encounters:  12/23/23 (!) 138/94  10/17/23 (!) 136/94  02/28/23 127/80   Wt Readings from Last 3 Encounters:  12/23/23 242 lb (109.8 kg)  10/17/23 239 lb 6.4 oz (108.6 kg)  01/17/23 228 lb 11.2 oz (103.7 kg)      Physical Exam Constitutional:      Appearance: Normal appearance.  HENT:     Head: Normocephalic and atraumatic.     Right Ear: Tympanic membrane, ear canal and external ear normal.     Left Ear: Tympanic membrane, ear canal and external ear  normal.     Nose: Nose normal.     Mouth/Throat:     Mouth: Mucous membranes are moist.     Pharynx: No oropharyngeal exudate or posterior oropharyngeal erythema.  Eyes:     General: No scleral icterus.    Extraocular Movements: Extraocular movements intact.     Conjunctiva/sclera: Conjunctivae normal.     Pupils: Pupils are equal, round, and reactive to light.  Neck:     Thyroid : No thyromegaly.     Vascular: No carotid bruit.  Cardiovascular:     Rate and Rhythm: Normal rate and regular rhythm.     Pulses: Normal pulses.     Heart sounds: Normal heart sounds. No murmur heard.    No friction rub.     Comments: B/l lateral ankle edema.  No pedal edema.  Trace edema of distal L lower leg superior to ankle. Pulmonary:     Effort: Pulmonary effort is normal.     Breath sounds: Normal breath sounds. No wheezing, rhonchi or rales.  Abdominal:     General: Bowel sounds are normal.     Palpations: Abdomen is soft.     Tenderness: There is no abdominal tenderness.  Musculoskeletal:        General: No deformity. Normal range of motion.  Lymphadenopathy:     Cervical: No cervical adenopathy.  Skin:    General: Skin is warm and dry.     Findings: No lesion.     Comments: Well healed surgical incision of R medial ankle.  Neurological:     General: No focal deficit present.     Mental Status: She is alert and oriented to person, place, and time.  Psychiatric:        Mood and Affect: Mood normal.        Thought Content: Thought content normal.        10/17/2023    3:45 PM 09/04/2021    1:08 PM 09/04/2021    8:48 AM  Depression screen PHQ 2/9  Decreased Interest 0 0 0  Down, Depressed, Hopeless 2 0 0  PHQ - 2 Score 2 0 0  Altered sleeping 1  1  Tired, decreased energy 0  0  Change in appetite 2  3  Feeling bad or failure about yourself  0  0  Trouble concentrating 0  0  Moving slowly or fidgety/restless 0  0  Suicidal thoughts 0  0  PHQ-9 Score 5  4  Difficult doing  work/chores Somewhat difficult  Not difficult at all  10/17/2023    3:46 PM 09/04/2021    8:49 AM 07/20/2021    8:30 AM 05/20/2021   11:24 AM  GAD 7 : Generalized Anxiety Score  Nervous, Anxious, on Edge 1 0 0 1  Control/stop worrying 1 0 0 1  Worry too much - different things 1 0 1 0  Trouble relaxing 0 0 0 0  Restless 0 0 0 0  Easily annoyed or irritable 0 1 1 1   Afraid - awful might happen 1 0 1 0  Total GAD 7 Score 4 1 3 3   Anxiety Difficulty Not difficult at all Not difficult at all  Somewhat difficult     No results found for any visits on 12/23/23.    Assessment & Plan:   Well adult exam -     CBC with Differential/Platelet; Future -     Comprehensive metabolic panel with GFR; Future -     Hemoglobin A1c; Future -     Lipid panel; Future -     T4, free; Future -     TSH; Future  Essential hypertension -     Comprehensive metabolic panel with GFR; Future -     T4, free; Future -     TSH; Future -     NIFEdipine  ER Osmotic Release; Take 1 tablet (30 mg total) by mouth daily.  Dispense: 90 tablet; Refill: 1  Attention deficit hyperactivity disorder (ADHD), unspecified ADHD type  Class 2 severe obesity with serious comorbidity and body mass index (BMI) of 37.0 to 37.9 in adult, unspecified obesity type -     Comprehensive metabolic panel with GFR; Future -     Lipid panel; Future -     T4, free; Future -     TSH; Future  Need for influenza vaccination -     Flu vaccine trivalent PF, 6mos and older(Flulaval,Afluria,Fluarix,Fluzone)  Ankle edema  Age appropriate health screenings.  Obtain labs. Immunizations reviewed.  Influenza vaccine given this visit.  Pap up to date, done 12/04/21 with OB/Gyn.  Mammogram due.  HTN uncontrolled likely contributing to current sx of HA, LE edema, etc.  Restart Procardia  XL 30mg  daily.   Start checking bp daily and keep along of readings.  Use of vyvanse may also contribute to bp elevation.  F/u with BH.  Close f/u in place.   Advised to keep Cardiology appt Monday.   Appreciate their assistance in this pt's care.   Lifestyle modifications strongly encouraged d/t morbid obesity, BMI 37.9 kg/m with comorbid condition HTN.   Decrease sodium intake, elevated LEs, compression socks for ankle edema likely increased due to uncontrolled bp.  Also consider CHF.  R ankle may swell more than L due to h/o surgery.  Return in about 6 weeks (around 02/03/2024) for blood pressure.   Clotilda JONELLE Single, MD

## 2023-12-23 NOTE — Telephone Encounter (Signed)
 Patient sent this message via Patient scheduling:  Pt c/o BP issue: STAT if pt c/o blurred vision, one-sided weakness or slurred speech.  STAT if BP is GREATER than 180/120 TODAY.  STAT if BP is LESS than 90/60 and SYMPTOMATIC TODAY  1. What is your BP concern? High blood pressure  2. Have you taken any BP medication today? no  3. What are your last 5 BP readings? 151/101  4. Are you having any other symptoms (ex. Dizziness, headache, blurred vision, passed out)?   My blood pressure is high (151/101). I started a new medication for adhd diagnosis. I want to stay on it, but it may be affecting my BP, so I'd like to know if I should go back on my hypertension med.

## 2023-12-23 NOTE — Telephone Encounter (Signed)
 Can we call to inquire if she has more than one BP reading? That will help us  to guide recommendations. Please also review med list to ensure it is up to date.   She has not been seen in a year, would recommend schedule follow up with Dr. Raford, PharmD, or APP.   Kaitlyn Choi S Ligaya Cormier, NP

## 2023-12-23 NOTE — Telephone Encounter (Signed)
 Discussed with PCP Dr. Mercer who is seeing her later today, she plans to address. Appreciate partnership of PCP team. Will readdress at upcoming follow up Monday.   Lavana Huckeba S Doyce Saling, NP

## 2023-12-23 NOTE — Telephone Encounter (Signed)
 Returned a call back to the pt.  Pt states all her BP readings have been registering in 150s systolic and 100s diastolic.  Pt states she was on Nifedipine  60 mg daily last year during her pregnancy, but this was taken away shortly after birth because her BP had normalized at that time.   Pt states she just started taking Vyvanse 40 mg every morning.   Pt also mentioned that she is having a physical today with PCP Dr. Mercer.  She states she will also piggy-back with Dr. Mercer about her recent BP issues.   Went ahead and scheduled the pt to come into the office to see Reche Finder, NP for next Monday 10/27 at 2:45 pm.  Pt aware to arrive 15 mins prior to this appt.  Pt aware I will route this message back to Reche Finder, NP for further review and advisement, and follow-up with her accordingly thereafter.  Pt verbalized understanding and agrees with this plan.

## 2023-12-24 LAB — CBC WITH DIFFERENTIAL/PLATELET
Absolute Lymphocytes: 2747 {cells}/uL (ref 850–3900)
Absolute Monocytes: 672 {cells}/uL (ref 200–950)
Basophils Absolute: 41 {cells}/uL (ref 0–200)
Basophils Relative: 0.5 %
Eosinophils Absolute: 131 {cells}/uL (ref 15–500)
Eosinophils Relative: 1.6 %
HCT: 40.4 % (ref 35.0–45.0)
Hemoglobin: 12.8 g/dL (ref 11.7–15.5)
MCH: 26.2 pg — ABNORMAL LOW (ref 27.0–33.0)
MCHC: 31.7 g/dL — ABNORMAL LOW (ref 32.0–36.0)
MCV: 82.6 fL (ref 80.0–100.0)
MPV: 9.8 fL (ref 7.5–12.5)
Monocytes Relative: 8.2 %
Neutro Abs: 4608 {cells}/uL (ref 1500–7800)
Neutrophils Relative %: 56.2 %
Platelets: 426 Thousand/uL — ABNORMAL HIGH (ref 140–400)
RBC: 4.89 Million/uL (ref 3.80–5.10)
RDW: 13 % (ref 11.0–15.0)
Total Lymphocyte: 33.5 %
WBC: 8.2 Thousand/uL (ref 3.8–10.8)

## 2023-12-24 LAB — COMPREHENSIVE METABOLIC PANEL WITH GFR
AG Ratio: 1.3 (calc) (ref 1.0–2.5)
ALT: 11 U/L (ref 6–29)
AST: 13 U/L (ref 10–30)
Albumin: 4.2 g/dL (ref 3.6–5.1)
Alkaline phosphatase (APISO): 82 U/L (ref 31–125)
BUN/Creatinine Ratio: 15 (calc) (ref 6–22)
BUN: 18 mg/dL (ref 7–25)
CO2: 27 mmol/L (ref 20–32)
Calcium: 9.4 mg/dL (ref 8.6–10.2)
Chloride: 100 mmol/L (ref 98–110)
Creat: 1.22 mg/dL — ABNORMAL HIGH (ref 0.50–0.99)
Globulin: 3.2 g/dL (ref 1.9–3.7)
Glucose, Bld: 91 mg/dL (ref 65–99)
Potassium: 3.8 mmol/L (ref 3.5–5.3)
Sodium: 138 mmol/L (ref 135–146)
Total Bilirubin: 0.4 mg/dL (ref 0.2–1.2)
Total Protein: 7.4 g/dL (ref 6.1–8.1)
eGFR: 56 mL/min/1.73m2 — ABNORMAL LOW (ref >=60)

## 2023-12-24 LAB — HEMOGLOBIN A1C
Hgb A1c MFr Bld: 6 % — ABNORMAL HIGH (ref <5.7)
Mean Plasma Glucose: 126 mg/dL
eAG (mmol/L): 7 mmol/L

## 2023-12-24 LAB — LIPID PANEL
Cholesterol: 154 mg/dL (ref <200)
HDL: 71 mg/dL (ref >=50)
LDL Cholesterol (Calc): 63 mg/dL
Non-HDL Cholesterol (Calc): 83 mg/dL (ref <130)
Total CHOL/HDL Ratio: 2.2 (calc) (ref <5.0)
Triglycerides: 122 mg/dL (ref <150)

## 2023-12-24 LAB — T4, FREE: Free T4: 1.1 ng/dL (ref 0.8–1.8)

## 2023-12-24 LAB — TSH: TSH: 2.57 m[IU]/L

## 2023-12-25 NOTE — Progress Notes (Deleted)
 Cardiology Office Note:    Date:  12/25/2023   ID:  Kaitlyn Choi, DOB 1980/12/03, MRN [id number]  PCP:  Mercer Clotilda SAUNDERS, MD   [facility] Providers Cardiologist:  None { Click to update primary MD,subspecialty MD or APP then REFRESH:1}    Referring MD: Mercer Clotilda SAUNDERS, MD   Chief complaint: Follow-up hypertension     History of Present Illness:   Kaitlyn Choi is a 43 y.o. female with a hx of HTN, asthma, obesity.  Referred to cardiology January 2024 for elevated blood pressure not responding to lisinopril .  BP remained above goal on labetalol , nifedipine  was added, as patient's did mention a desire for future pregnancy at that time.  For ease of administration, patient was taken off labetalol  and left on the nifedipine .  Patient called the clinic 12/23/2023 with an elevated BP reading of 151/101 following starting a new medication for ADHD (Vyvanse 40 mg).  Patient was advised to start a blood pressure log and follow-up in the office.  ROS:   Please see the history of present illness.    *** All other systems reviewed and are negative.     Past Medical History:  Diagnosis Date   Abnormal urinalysis 12/17/2020   Allergic conjunctivitis, left eye 09/04/2021   Allergy Not sure   Skin allergies   Annual physical exam 04/16/2021   Anxiety    Anxiety and depression 12/17/2020   Asthma    Asthma action plan declined 12/17/1983   Chronic right-sided low back pain without sciatica 12/17/2020   Constipation 09/04/2021   COVID 03/12/2021   Depression    Flank pain 12/17/2020   Hypertension    Insomnia    Right upper quadrant pain 04/16/2021    Past Surgical History:  Procedure Laterality Date   ANKLE FRACTURE SURGERY Right 12/2015   EYE SURGERY  2016   PRK to correct vision.   FRACTURE SURGERY  2017   Ankle fracture    Current Medications: No outpatient medications have been marked as taking for the 12/26/23 encounter (Appointment) with Vannie Reche RAMAN, NP.     Allergies:   Latex   Social History   Socioeconomic History   Marital status: Single    Spouse name: Not on file   Number of children: 0   Years of education: 16   Highest education level: Bachelor's degree (e.g., BA, AB, BS)  Occupational History   Occupation: [employer], [employer]  Tobacco Use   Smoking status: Never   Smokeless tobacco: Never  Vaping Use   Vaping status: Never Used  Substance and Sexual Activity   Alcohol use: Not Currently   Drug use: Never   Sexual activity: Yes    Partners: Male    Birth control/protection: None  Other Topics Concern   Not on file  Social History Narrative   Not on file   Social Drivers of Health   Financial Resource Strain: Not on file  Food Insecurity: No Food Insecurity (12/25/2022)   Hunger Vital Sign    Worried About Running Out of Food in the Last Year: Never true    Ran Out of Food in the Last Year: Never true  Transportation Needs: No Transportation Needs (12/25/2022)   PRAPARE - Administrator, Civil Service (Medical): No    Lack of Transportation (Non-Medical): No  Physical Activity: Not on file  Stress: Not on file  Social Connections: Not on file     Family History: The patient's ***family  history includes Asthma in her paternal grandmother; Breast cancer in her paternal grandmother; Cancer in her maternal aunt, maternal grandfather, maternal grandmother, paternal grandmother, and paternal uncle; Depression in her maternal aunt, maternal aunt, maternal grandmother, and maternal uncle; Diabetes in her paternal grandmother; Heart disease in her maternal grandmother; Hypertension in her father, maternal grandmother, and paternal grandmother; Kidney disease in her paternal grandmother; Kidney failure in her paternal grandmother; Lung cancer in her maternal grandmother; Obesity in her maternal aunt, maternal aunt, maternal uncle, mother, paternal aunt, and paternal grandmother; Stroke in her  father, maternal uncle, and paternal grandfather; Sudden Cardiac Death in her maternal grandfather; Varicose Veins in her maternal aunt, maternal aunt, and mother.  EKGs/Labs/Other Studies Reviewed:    The following studies were reviewed today: ***      Recent Labs: 12/23/2023: ALT 11; BUN 18; Creat 1.22; Hemoglobin 12.8; Platelets 426; Potassium 3.8; Sodium 138; TSH 2.57  Recent Lipid Panel    Component Value Date/Time   CHOL 154 12/23/2023 1523   CHOL 144 04/16/2021 1035   TRIG 122 12/23/2023 1523   HDL 71 12/23/2023 1523   HDL 81 04/16/2021 1035   CHOLHDL 2.2 12/23/2023 1523   LDLCALC 63 12/23/2023 1523     Risk Assessment/Calculations:   {Does this patient have ATRIAL FIBRILLATION?:[id number]}  No BP recorded.  {Refresh Note OR Click here to enter BP  :1}***         Physical Exam:    VS:  There were no vitals taken for this visit.       Wt Readings from Last 3 Encounters:  12/23/23 242 lb (109.8 kg)  10/17/23 239 lb 6.4 oz (108.6 kg)  01/17/23 228 lb 11.2 oz (103.7 kg)     GEN: *** Well nourished, well developed in no acute distress HEENT: Normal NECK: No JVD; No carotid bruits CARDIAC: *** S1-S2 normal, RRR, no murmurs, rubs, gallops RESPIRATORY:  Clear to auscultation without rales, wheezing or rhonchi  MUSCULOSKELETAL:  No edema; No deformity  SKIN: Warm and dry NEUROLOGIC:  Alert and oriented x 3 PSYCHIATRIC:  Normal affect       Assessment & Plan        {Are you ordering a CV Procedure (e.g. stress test, cath, DCCV, TEE, etc)?   Press F2        :[id number]}    Medication Adjustments/Labs and Tests Ordered: Current medicines are reviewed at length with the patient today.  Concerns regarding medicines are outlined above.  No orders of the defined types were placed in this encounter.  No orders of the defined types were placed in this encounter.   There are no Patient Instructions on file for this visit.   Signed, Miriam FORBES Shams, NP   12/25/2023 7:46 PM    [facility]

## 2023-12-26 ENCOUNTER — Ambulatory Visit (HOSPITAL_BASED_OUTPATIENT_CLINIC_OR_DEPARTMENT_OTHER): Admitting: Emergency Medicine

## 2024-01-10 ENCOUNTER — Encounter (HOSPITAL_BASED_OUTPATIENT_CLINIC_OR_DEPARTMENT_OTHER): Payer: Self-pay | Admitting: Family

## 2024-01-10 ENCOUNTER — Ambulatory Visit: Payer: Self-pay | Admitting: Family Medicine

## 2024-01-10 ENCOUNTER — Ambulatory Visit (INDEPENDENT_AMBULATORY_CARE_PROVIDER_SITE_OTHER): Admitting: Family

## 2024-01-10 VITALS — BP 128/86 | HR 93 | Ht 67.0 in | Wt 236.0 lb

## 2024-01-10 DIAGNOSIS — I10 Essential (primary) hypertension: Secondary | ICD-10-CM

## 2024-01-10 NOTE — Patient Instructions (Signed)
 Medication Instructions:  Continue your current medications.   *If you need a refill on your cardiac medications before your next appointment, please call your pharmacy*  Lab Work: Your physician recommends that you return for lab work today: BMET If you have labs (blood work) drawn today and your tests are completely normal, you will receive your results only by: MyChart Message (if you have MyChart) OR A paper copy in the mail If you have any lab test that is abnormal or we need to change your treatment, we will call you to review the results.  Testing/Procedures: EKG looked good today!  Follow-Up: At [facility], you and your health needs are our priority.  As part of our continuing mission to provide you with exceptional heart care, our providers are all part of one team.  This team includes your primary Cardiologist (physician) and Advanced Practice Providers or APPs (Physician Assistants and Nurse Practitioners) who all work together to provide you with the care you need, when you need it.  Your next appointment:   In January as scheduled  We recommend signing up for the patient portal called MyChart.  Sign up information is provided on this After Visit Summary.  MyChart is used to connect with patients for Virtual Visits (Telemedicine).  Patients are able to view lab/test results, encounter notes, upcoming appointments, etc.  Non-urgent messages can be sent to your provider as well.   To learn more about what you can do with MyChart, go to [email or web address].   Other Instructions  Heart Healthy Diet Recommendations: A low-salt diet is recommended. Meats should be grilled, baked, or boiled. Avoid fried foods. Focus on lean protein sources like fish or chicken with vegetables and fruits. The American Heart Association is a Chief Technology Officer!  American Heart Association Diet and Lifeystyle Recommendations   Exercise recommendations: The American Heart Association  recommends 150 minutes of moderate intensity exercise weekly. Try 30 minutes of moderate intensity exercise 4-5 times per week. This could include walking, jogging, or swimming.  Tips to Measure your Blood Pressure Correctly  To determine whether you have hypertension, a medical professional will take a blood pressure reading. How you prepare for the test, the position of your arm, and other factors can change a blood pressure reading by 10% or more. That could be enough to hide high blood pressure, start you on a drug you don't really need, or lead your doctor to incorrectly adjust your medications.  National and international guidelines offer specific instructions for measuring blood pressure. If a doctor, nurse, or medical assistant isn't doing it right, don't hesitate to ask him or her to get with the guidelines.  Here's what you can do to ensure a correct reading:  Don't drink a caffeinated beverage or smoke during the 30 minutes before the test.  Sit quietly for five minutes before the test begins.  During the measurement, sit in a chair with your feet on the floor and your arm supported so your elbow is at about heart level.  The inflatable part of the cuff should completely cover at least 80% of your upper arm, and the cuff should be placed on bare skin, not over a shirt.  Don't talk during the measurement.  Have your blood pressure measured twice, with a brief break in between. If the readings are different by 5 points or more, have it done a third time.  In 2017, new guidelines from the American Heart Association, the [employer] of Cardiology, and nine  other health organizations lowered the diagnosis of high blood pressure to 130/80 mm Hg or higher for all adults. The guidelines also redefined the various blood pressure categories to now include normal, elevated, Stage 1 hypertension, Stage 2 hypertension, and hypertensive crisis (see Blood pressure categories).  Blood pressure  categories  Blood pressure category SYSTOLIC (upper number)  DIASTOLIC (lower number)  Normal Less than 120 mm Hg and Less than 80 mm Hg  Elevated 120-129 mm Hg and Less than 80 mm Hg  High blood pressure: Stage 1 hypertension 130-139 mm Hg or 80-89 mm Hg  High blood pressure: Stage 2 hypertension 140 mm Hg or higher or 90 mm Hg or higher  Hypertensive crisis (consult your doctor immediately) Higher than 180 mm Hg and/or Higher than 120 mm Hg  Source: American Heart Association and American Stroke Association. For more on getting your blood pressure under control, buy Controlling Your Blood Pressure, a Special Health Report from [facility].   Blood Pressure Log   Date   Time  Blood Pressure  Position  Example: Nov 1 9 AM 124/78 sitting

## 2024-01-10 NOTE — Progress Notes (Signed)
 Cardiology Office Note   Date:  01/10/2024  ID:  Kaitlyn Choi, DOB 27-Jun-1980, MRN [id number] PCP: Kaitlyn Clotilda SAUNDERS, MD  [facility] Providers Cardiologist:  Kaitlyn Scarce, MD     History of Present Illness Kaitlyn Choi is a 43 y.o. female with history of hypertension, asthma.  Family history notable for father passing away from a stroke in his 71s, grandparents with hypertension.  Established with Dr. Scarce [facility] 03/08/22.  Hypertension diagnosed the year prior.  She preferred her new PCP as was not tolerating lisinopril  and had been started on labetalol .  Nifedipine  later added for BP control due to desire for future pregnancy.  At initial visit 03/08/2022 for ease of administration labetalol  stopped and nifedipine  resumed.  Most recent evaluation by Allean, PharmD 12/01/2022.  She was 35 weeks 4 days pregnant with plans to be induced at 39 weeks.  Her BP was well-controlled on nifedipine  60 mg daily.  There was no preeclampsia nor gestational diabetes.  She contacted in office noting elevated blood pressures in the 150s.  She had also recently started Vyvanse.  Overdue schedule cardiology follow-up scheduled.  She was evaluated by her PCP that day and nifedipine  30 mg daily initiated.  CBC with no anemia, CMP with increased creatinine 1.22, normal TSH/T4, lipid panel total cholesterol 154, HDL 71, triglycerides 122, LDL 63, A1c in prediabetic range at 6.  Presents today for follow-up with her 23-year-old son Kaitlyn Choi. Reports since starting Nifedipine  headaches have resolved. Prior nausea has resolved. BP at home intermittently around 120s/90s when checked with arm cuff. No chest pain pressure, tightness. Has been using her inhaler more which she attributes to the weather. Swelling in her ankles improved since resuming antihypertensives. Has adjusted her diet to eat more at home with successful recent weight loss.  ROS: Please see the history of present  illness.    All other systems reviewed and are negative.   Studies Reviewed EKG Interpretation Date/Time:  Tuesday January 10 2024 13:56:05 EST Ventricular Rate:  93 PR Interval:  184 QRS Duration:  86 QT Interval:  358 QTC Calculation: 445 R Axis:   -34  Text Interpretation: Normal sinus rhythm Left axis deviation No acute ST/T wave changes Artiface noted in leads V4-6 Confirmed by Vannie Mora ([id number]) on 01/10/2024 1:59:46 PM        Risk Assessment/Calculations           Physical Exam VS:  BP 128/86   Pulse 93   Ht 5' 7 (1.702 m)   Wt 236 lb (107 kg)   SpO2 99%   BMI 36.96 kg/m        Wt Readings from Last 3 Encounters:  01/10/24 236 lb (107 kg)  12/23/23 242 lb (109.8 kg)  10/17/23 239 lb 6.4 oz (108.6 kg)    GEN: Well nourished, well developed in no acute distress NECK: No JVD; No carotid bruits CARDIAC: RRR, no murmurs, rubs, gallops RESPIRATORY:  Clear to auscultation without rales, wheezing or rhonchi  ABDOMEN: Soft, non-tender, non-distended EXTREMITIES:  No edema; No deformity   ASSESSMENT AND PLAN  HTN - BP improved with addition of Nifedipien 30mg  daily, continue same. Lifestyle changes including low sodium diet, increasing physical activity to per week encouraged. Discussed to monitor BP at home at least 2 hours after medications and sitting for 5-10 minutes.   Elevated creatinine - labs 12/23/23 with elevated creatinine 1.11 increased from her baseline of 0.97. Repeat BMET to reassess. Increased hydration advised.  Dispo: follow up in January 2026 in [facility]   Signed, Reche GORMAN Finder, NP

## 2024-01-11 ENCOUNTER — Ambulatory Visit (HOSPITAL_BASED_OUTPATIENT_CLINIC_OR_DEPARTMENT_OTHER): Payer: Self-pay | Admitting: Family

## 2024-01-11 DIAGNOSIS — I10 Essential (primary) hypertension: Secondary | ICD-10-CM

## 2024-01-11 DIAGNOSIS — Z79899 Other long term (current) drug therapy: Secondary | ICD-10-CM

## 2024-01-11 DIAGNOSIS — R7989 Other specified abnormal findings of blood chemistry: Secondary | ICD-10-CM

## 2024-01-11 LAB — BASIC METABOLIC PANEL WITH GFR
BUN/Creatinine Ratio: 17 (ref 9–23)
BUN: 21 mg/dL (ref 6–24)
CO2: 21 mmol/L (ref 20–29)
Calcium: 9.5 mg/dL (ref 8.7–10.2)
Chloride: 102 mmol/L (ref 96–106)
Creatinine, Ser: 1.24 mg/dL — ABNORMAL HIGH (ref 0.57–1.00)
Glucose: 101 mg/dL — ABNORMAL HIGH (ref 70–99)
Potassium: 4.8 mmol/L (ref 3.5–5.2)
Sodium: 139 mmol/L (ref 134–144)
eGFR: 55 mL/min/1.73 — ABNORMAL LOW (ref >59)

## 2024-01-12 DIAGNOSIS — F411 Generalized anxiety disorder: Secondary | ICD-10-CM | POA: Diagnosis not present

## 2024-01-12 DIAGNOSIS — F902 Attention-deficit hyperactivity disorder, combined type: Secondary | ICD-10-CM | POA: Diagnosis not present

## 2024-01-12 DIAGNOSIS — G4700 Insomnia, unspecified: Secondary | ICD-10-CM | POA: Diagnosis not present

## 2024-01-12 DIAGNOSIS — F331 Major depressive disorder, recurrent, moderate: Secondary | ICD-10-CM | POA: Diagnosis not present

## 2024-01-12 NOTE — Telephone Encounter (Signed)
 The patient has been notified of the result and verbalized understanding.  All questions (if any) were answered.  Advised the pt to increase her po water intake and return for repeat BMET in 2 weeks.   Pt education about how much water intake she should be consuming a day.   Pt verbalized understanding and agrees with this plan.

## 2024-01-12 NOTE — Telephone Encounter (Signed)
-----   Message from Reche GORMAN Finder sent at 01/11/2024  9:19 AM EST ----- Kidney function similar to labs 12/23/23. Kidney function has declined from her baseline. Her present medications would not impact kidney function. Recommend increasing hydration, ideally 64-80 oz of  water per day. Repeat BMET in 2 weeks for monitoring.  ----- Message ----- From: Interface, [facility] Lab Results In Sent: 01/11/2024   1:36 AM EST To: Reche GORMAN Finder, NP

## 2024-02-03 ENCOUNTER — Ambulatory Visit: Admitting: Family Medicine

## 2024-02-10 ENCOUNTER — Encounter: Payer: Self-pay | Admitting: Family Medicine

## 2024-02-10 ENCOUNTER — Ambulatory Visit: Admitting: Family Medicine

## 2024-02-10 VITALS — BP 120/82 | HR 106 | Temp 98.3°F | Ht 67.0 in | Wt 237.8 lb

## 2024-02-10 DIAGNOSIS — I10 Essential (primary) hypertension: Secondary | ICD-10-CM

## 2024-02-10 DIAGNOSIS — F4323 Adjustment disorder with mixed anxiety and depressed mood: Secondary | ICD-10-CM | POA: Diagnosis not present

## 2024-02-10 DIAGNOSIS — F909 Attention-deficit hyperactivity disorder, unspecified type: Secondary | ICD-10-CM

## 2024-02-10 DIAGNOSIS — F419 Anxiety disorder, unspecified: Secondary | ICD-10-CM | POA: Diagnosis not present

## 2024-02-10 DIAGNOSIS — F32A Depression, unspecified: Secondary | ICD-10-CM | POA: Diagnosis not present

## 2024-02-10 DIAGNOSIS — N179 Acute kidney failure, unspecified: Secondary | ICD-10-CM | POA: Diagnosis not present

## 2024-02-10 NOTE — Progress Notes (Signed)
 Established Patient Office Visit   Subjective  Patient ID: Kaitlyn Choi, female    DOB: 1980-11-12  Age: 43 y.o. MRN: [id number]  Chief Complaint  Patient presents with   Medical Management of Chronic Issues    Patient came in today for a 6 week follow-up for Blood pressure    Patient is a 43 year old female seen for follow-up on HTN.  Patient taking nifedipine  XL 30 mg daily.  Notes feeling better since restarting blood pressure medication.  Occasionally checking at home, BP 120s-126/82-85.  Being seen by cardiology.  Mentions was unable to have labs redrawn after last visit.  Increasing physical activity some since last visit.  Using her 20 pound baby for exercise by lifting him and dancing with him.  Patient in therapy.  Working on boundaries.  Often forgets to eat or drink water during the day.  Notes decreased sleep over the last few days of staying up late to complete work.  Patient is a single mom.  States her mother is in town helping her watch the baby.  HR elevated.  Drinking coffee currently.  Has not had any water today.  Denies headaches, CP, changes in vision, SOB, LE edema other than from prolonged sitting at desk which resolves when laying down.    Patient Active Problem List   Diagnosis Date Noted   Postpartum care following vaginal delivery 12/28/2022   Chronic hypertension in pregnancy 12/25/2022   AMA (advanced maternal age) multigravida 35+, second trimester 07/20/2022   Primary hypertension 05/20/2021   Class 2 severe obesity due to excess calories with serious comorbidity and body mass index (BMI) of 36.0 to 36.9 in adult 03/12/2021   Past Medical History:  Diagnosis Date   Abnormal urinalysis 12/17/2020   Allergic conjunctivitis, left eye 09/04/2021   Allergy Not sure   Skin allergies   Annual physical exam 04/16/2021   Anxiety    Anxiety and depression 12/17/2020   Asthma    Asthma action plan declined 12/17/1983   Chronic right-sided low back pain without  sciatica 12/17/2020   Constipation 09/04/2021   COVID 03/12/2021   Depression    Flank pain 12/17/2020   Hypertension    Insomnia    Right upper quadrant pain 04/16/2021   Past Surgical History:  Procedure Laterality Date   ANKLE FRACTURE SURGERY Right 12/2015   EYE SURGERY  2016   PRK to correct vision.   FRACTURE SURGERY  2017   Ankle fracture   Social History[1] Family History  Problem Relation Age of Onset   Obesity Mother    Varicose Veins Mother    Hypertension Father    Stroke Father    Cancer Maternal Aunt    Stroke Maternal Uncle    Cancer Paternal Uncle    Cancer Maternal Grandmother    Heart disease Maternal Grandmother    Hypertension Maternal Grandmother    Lung cancer Maternal Grandmother    Depression Maternal Grandmother    Sudden Cardiac Death Maternal Grandfather    Cancer Maternal Grandfather    Diabetes Paternal Grandmother    Cancer Paternal Grandmother    Asthma Paternal Grandmother    Hypertension Paternal Grandmother    Kidney failure Paternal Grandmother    Breast cancer Paternal Grandmother    Kidney disease Paternal Grandmother    Obesity Paternal Grandmother    Stroke Paternal Grandfather    Depression Maternal Aunt    Varicose Veins Maternal Aunt    Depression Maternal Aunt    Obesity  Maternal Aunt    Varicose Veins Maternal Aunt    Depression Maternal Uncle    Obesity Maternal Uncle    Obesity Maternal Aunt    Obesity Paternal Aunt    Allergies[2]  ROS Negative unless stated above    Objective:     BP 120/82 (BP Location: Left Arm, Patient Position: Sitting, Cuff Size: Large)   Pulse (!) 106   Temp 98.3 F (36.8 C) (Oral)   Ht 5' 7 (1.702 m)   Wt 237 lb 12.8 oz (107.9 kg)   LMP 02/03/2024 (Exact Date)   SpO2 99%   BMI 37.24 kg/m  BP Readings from Last 3 Encounters:  02/10/24 120/82  01/10/24 128/86  12/23/23 (!) 138/94   Wt Readings from Last 3 Encounters:  02/10/24 237 lb 12.8 oz (107.9 kg)  01/10/24 236 lb  (107 kg)  12/23/23 242 lb (109.8 kg)      Physical Exam Constitutional:      Appearance: Normal appearance.  HENT:     Head: Normocephalic and atraumatic.     Nose: Nose normal.     Mouth/Throat:     Mouth: Mucous membranes are moist.  Eyes:     Conjunctiva/sclera: Conjunctivae normal.  Cardiovascular:     Rate and Rhythm: Tachycardia present.  Pulmonary:     Effort: Pulmonary effort is normal.  Skin:    General: Skin is warm and dry.  Neurological:     Mental Status: She is alert and oriented to person, place, and time.        02/10/2024    1:43 PM 12/23/2023    3:23 PM 10/17/2023    3:45 PM  Depression screen PHQ 2/9  Decreased Interest 2 0 0  Down, Depressed, Hopeless 0 2 2  PHQ - 2 Score 2 2 2   Altered sleeping 2 0 1  Tired, decreased energy 2 3 0  Change in appetite 3 3 2   Feeling bad or failure about yourself  0 0 0  Trouble concentrating 0 0 0  Moving slowly or fidgety/restless 0 0 0  Suicidal thoughts 0 0 0  PHQ-9 Score 9 8  5    Difficult doing work/chores Not difficult at all Somewhat difficult Somewhat difficult     Data saved with a previous flowsheet row definition      02/10/2024    1:43 PM 12/23/2023    3:23 PM 10/17/2023    3:46 PM 09/04/2021    8:49 AM  GAD 7 : Generalized Anxiety Score  Nervous, Anxious, on Edge 3 2 1  0  Control/stop worrying 0 1 1 0  Worry too much - different things 0 2 1 0  Trouble relaxing 3 0 0 0  Restless 0 0 0 0  Easily annoyed or irritable 1 0 0 1  Afraid - awful might happen 0 0 1 0  Total GAD 7 Score 7 5 4 1   Anxiety Difficulty Not difficult at all Somewhat difficult Not difficult at all Not difficult at all     No results found for any visits on 02/10/24.    Assessment & Plan:   Essential hypertension -     Basic metabolic panel with GFR  AKI (acute kidney injury) -     Basic metabolic panel with GFR  Anxiety and depression  BP improving.  Discussed the importance of lifestyle modifications  including remembering to eat and hydration during the day.  Will wait on adjusting medication.  Advised for continued elevation in diastolic BP  and additional medicine is likely a better option to current regimen as increasing nifedipine  would likely not be tolerated.  Recheck renal function.  Creatinine 1.2 and 1.24 on 12/19/2023 and 01/10/2024.  Continue follow-up with cardiology.  Discussed importance of self-care.  Anxiety and depression stable.  PHQ-9 score 9, GAD-7 score 7 this visit.  Continue with therapy and zoloft  100 mg daily.  Return in about 3 months (around 05/10/2024) for blood pressure.   Kaitlyn Choi Single, MD     [1]  Social History Tobacco Use   Smoking status: Never    Passive exposure: Never   Smokeless tobacco: Never  Vaping Use   Vaping status: Never Used  Substance Use Topics   Alcohol use: Not Currently   Drug use: Never  [2]  Allergies Allergen Reactions   Latex Rash

## 2024-02-11 LAB — BASIC METABOLIC PANEL WITH GFR
BUN: 16 mg/dL (ref 7–25)
CO2: 26 mmol/L (ref 20–32)
Calcium: 9.4 mg/dL (ref 8.6–10.2)
Chloride: 100 mmol/L (ref 98–110)
Creat: 0.98 mg/dL (ref 0.50–0.99)
Glucose, Bld: 99 mg/dL (ref 65–99)
Potassium: 3.9 mmol/L (ref 3.5–5.3)
Sodium: 137 mmol/L (ref 135–146)
eGFR: 73 mL/min/1.73m2 (ref >=60)

## 2024-02-15 ENCOUNTER — Ambulatory Visit: Payer: Self-pay | Admitting: Family Medicine

## 2024-02-21 DIAGNOSIS — F4323 Adjustment disorder with mixed anxiety and depressed mood: Secondary | ICD-10-CM | POA: Diagnosis not present

## 2024-03-12 ENCOUNTER — Ambulatory Visit (HOSPITAL_BASED_OUTPATIENT_CLINIC_OR_DEPARTMENT_OTHER): Admitting: Family

## 2024-03-26 ENCOUNTER — Encounter (HOSPITAL_BASED_OUTPATIENT_CLINIC_OR_DEPARTMENT_OTHER): Admitting: Family

## 2024-05-10 ENCOUNTER — Encounter (HOSPITAL_BASED_OUTPATIENT_CLINIC_OR_DEPARTMENT_OTHER): Admitting: Family

## 2024-06-11 ENCOUNTER — Ambulatory Visit: Admitting: Family Medicine
